# Patient Record
Sex: Female | Born: 1935 | Race: White | Hispanic: No | Marital: Married | Smoking: Former smoker
Health system: Southern US, Community
[De-identification: ages and names within clinical notes are randomized; demographics above are authoritative.]

## PROBLEM LIST (undated history)

## (undated) DIAGNOSIS — I10 Essential (primary) hypertension: Secondary | ICD-10-CM

## (undated) DIAGNOSIS — E119 Type 2 diabetes mellitus without complications: Secondary | ICD-10-CM

---

## 2008-07-18 DIAGNOSIS — I2699 Other pulmonary embolism without acute cor pulmonale: Secondary | ICD-10-CM

## 2008-07-18 HISTORY — PX: REPLACEMENT TOTAL KNEE: SUR1224

## 2008-07-18 HISTORY — DX: Other pulmonary embolism without acute cor pulmonale: I26.99

## 2009-05-20 ENCOUNTER — Ambulatory Visit (HOSPITAL_COMMUNITY): Admission: RE | Admit: 2009-05-20 | Discharge: 2009-05-20 | Payer: Self-pay | Admitting: Orthopedic Surgery

## 2009-05-22 ENCOUNTER — Inpatient Hospital Stay (HOSPITAL_COMMUNITY): Admission: RE | Admit: 2009-05-22 | Discharge: 2009-06-01 | Payer: Self-pay | Admitting: Orthopedic Surgery

## 2009-05-22 ENCOUNTER — Ambulatory Visit (HOSPITAL_COMMUNITY): Admission: RE | Admit: 2009-05-22 | Discharge: 2009-05-22 | Payer: Self-pay | Admitting: Orthopedic Surgery

## 2009-05-24 ENCOUNTER — Encounter (INDEPENDENT_AMBULATORY_CARE_PROVIDER_SITE_OTHER): Payer: Self-pay | Admitting: Internal Medicine

## 2009-05-25 ENCOUNTER — Encounter (INDEPENDENT_AMBULATORY_CARE_PROVIDER_SITE_OTHER): Payer: Self-pay | Admitting: Internal Medicine

## 2009-05-26 ENCOUNTER — Ambulatory Visit: Payer: Self-pay | Admitting: Vascular Surgery

## 2010-05-12 IMAGING — CT CT ANGIO CHEST
2 of 6 series · 19 of 36 positions shown · IV contrast (APPLIED)
Comparison: None.

CLINICAL DATA: Postop day #2 from knee replacement.  Decreasing O2
sats.  Tachycardia.

CT ANGIOGRAPHY CHEST WITH CONTRAST
TECHNIQUE: Multidetector CT imaging of the chest was performed
using the standard protocol during bolus administration of
intravenous contrast. Multiplanar CT image reconstructions
including MIPs were obtained to evaluate the vascular anatomy.
Contrast: 80 ml [id number]-D88 .

[Series 6: thins · axial · 0.70mm/px · z∈[+954,+1174]mm · 18 of 246 slices shown]
[im 13/246  lung]
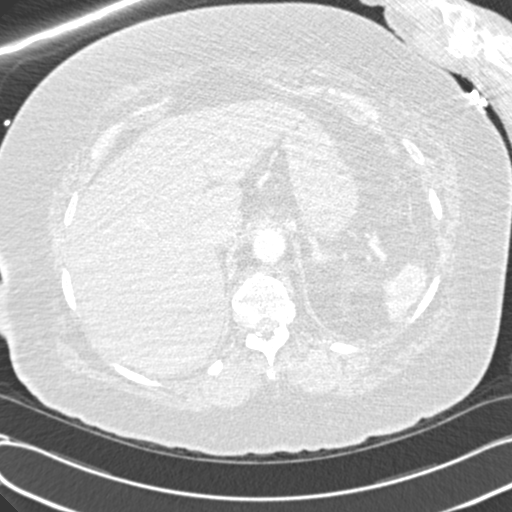
[im 25/246  mediastinal]
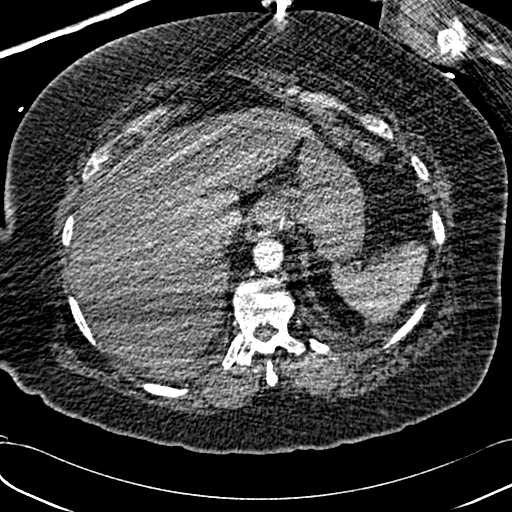
[im 37/246  lung]
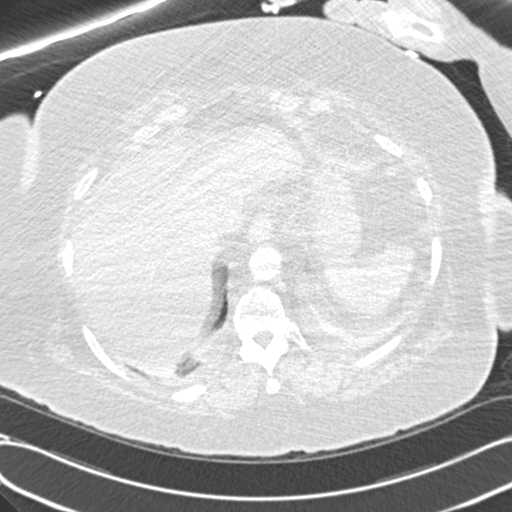
[im 50/246  mediastinal]
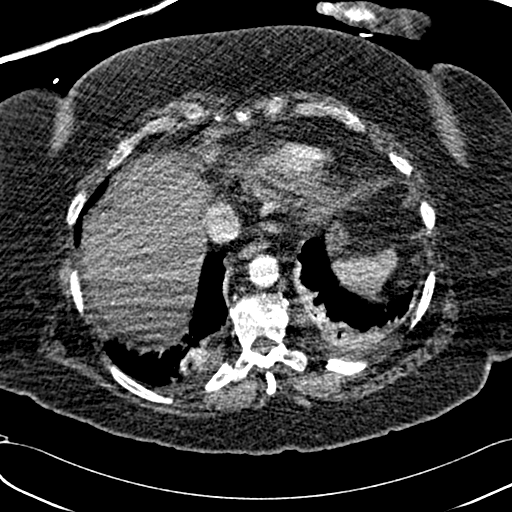
[im 62/246  lung]
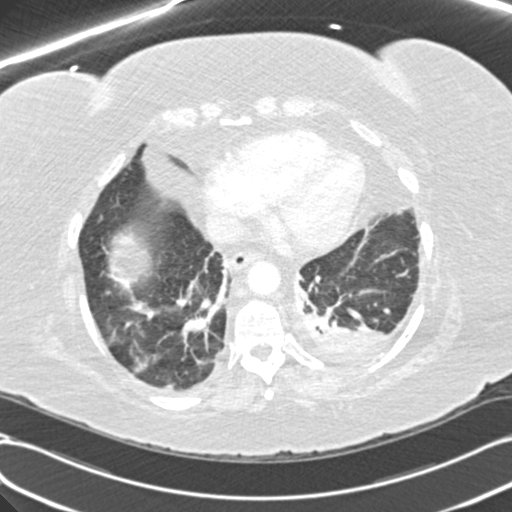
[im 74/246  mediastinal]
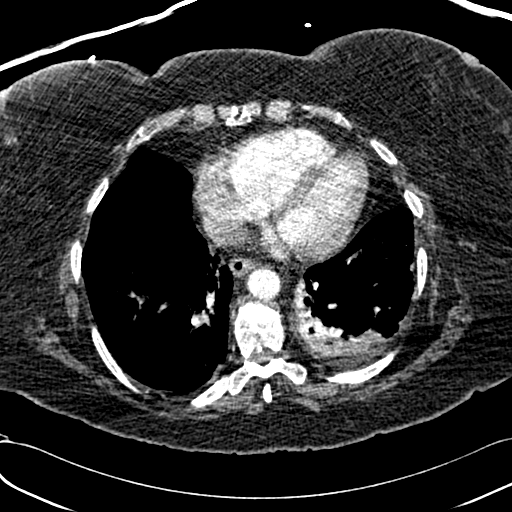
[im 86/246  lung]
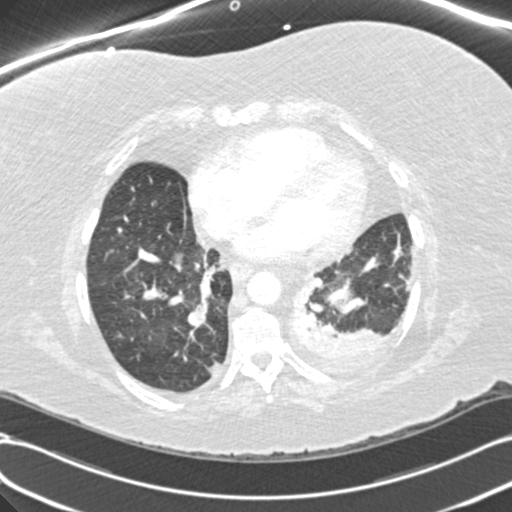
[im 99/246  mediastinal]
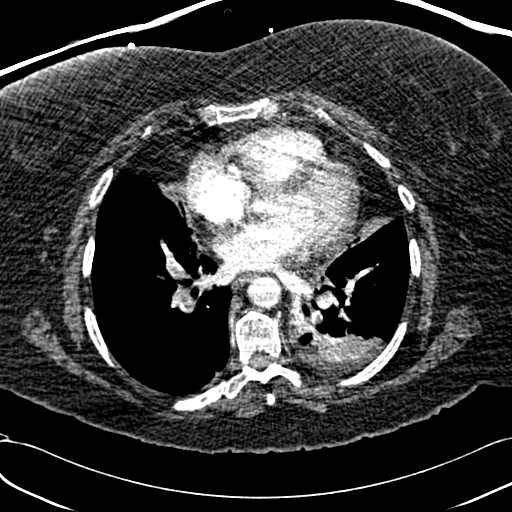
[im 111/246  lung]
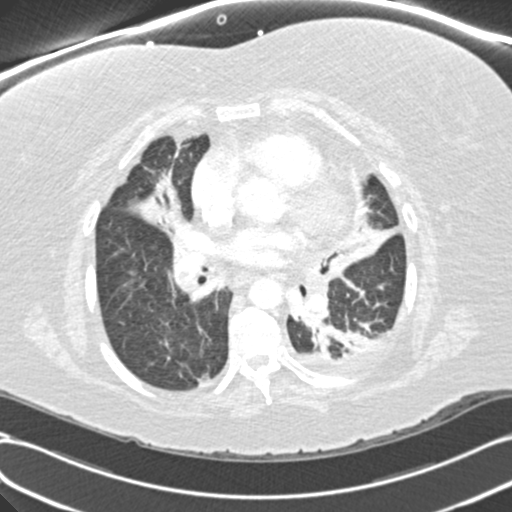
[im 135/246  mediastinal]
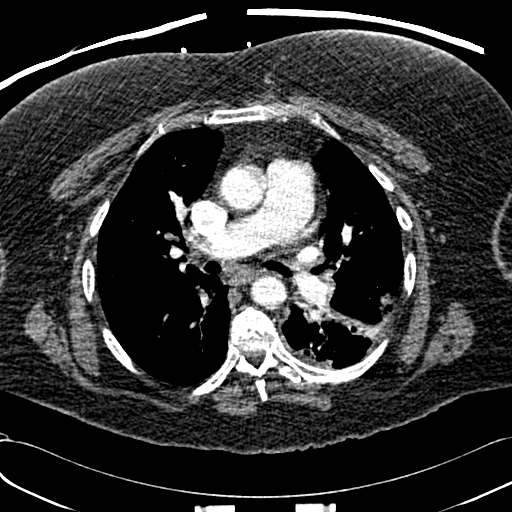
[im 148/246  lung]
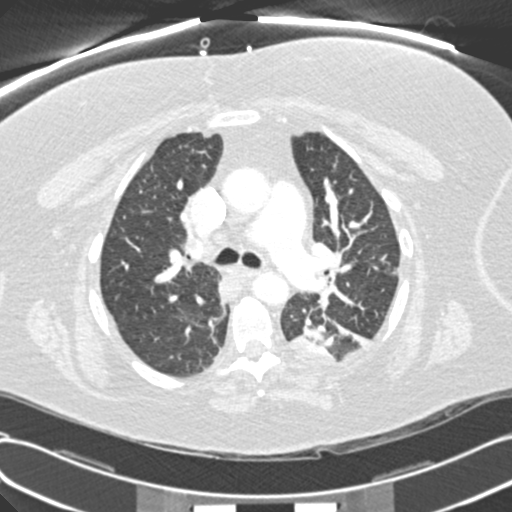
[im 160/246  mediastinal]
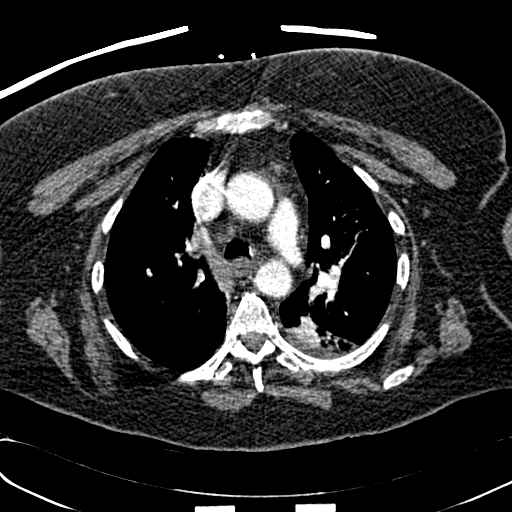
[im 172/246  lung]
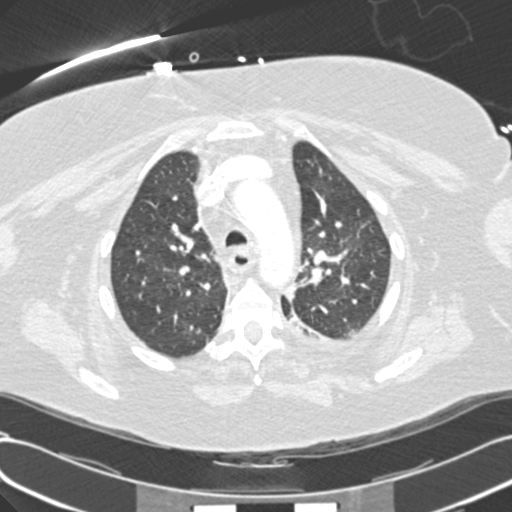
[im 184/246  mediastinal]
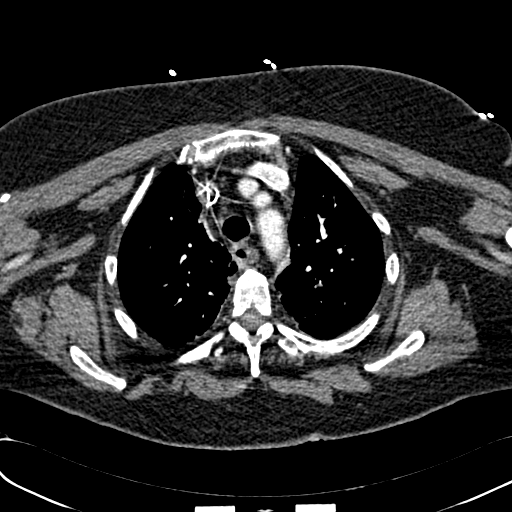
[im 197/246  lung]
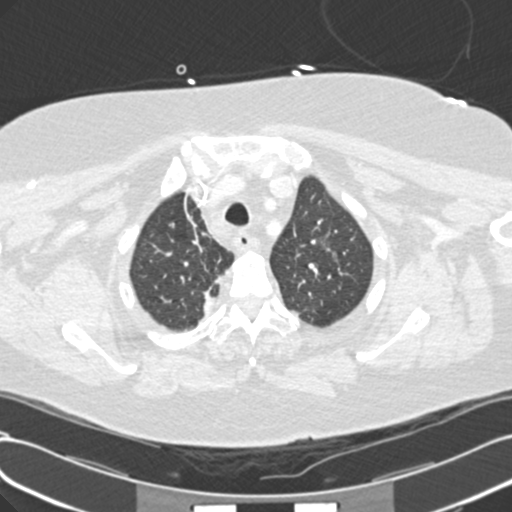
[im 209/246  mediastinal]
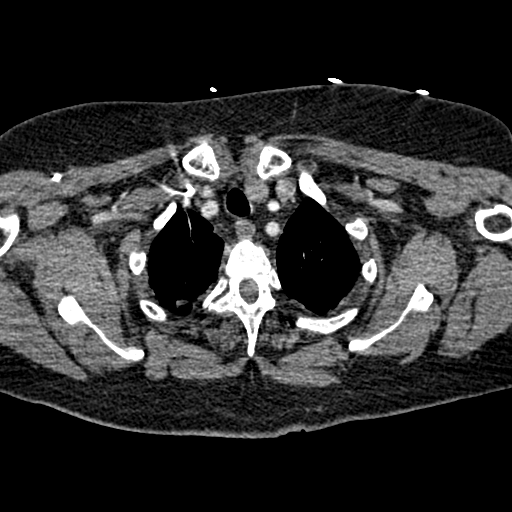
[im 221/246  lung]
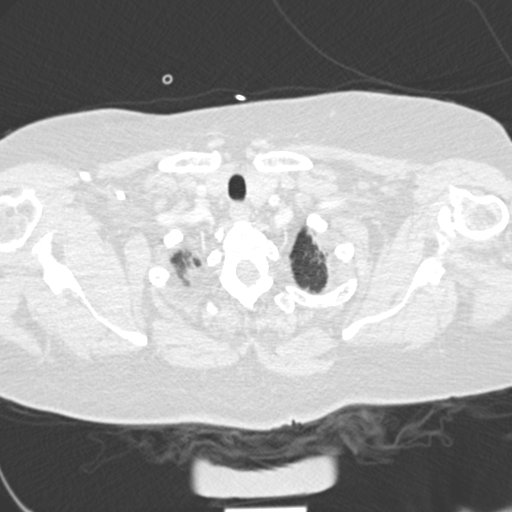
[im 233/246  mediastinal]
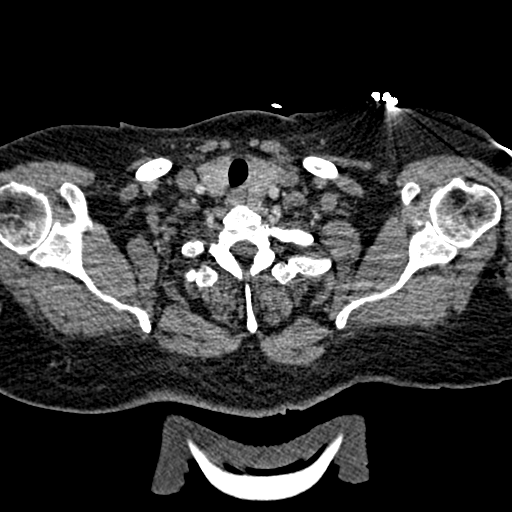

[Series 604: coronal mpr · coronal · 0.70mm/px · 1 of 124 slices shown]
[im 62/124  mediastinal]
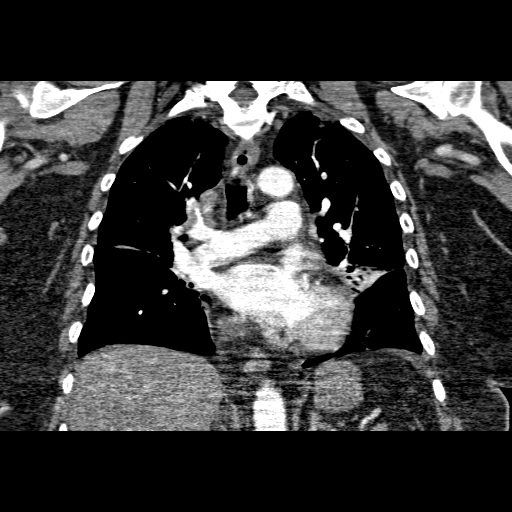

[19 of 36 positions shown; findings below may reference images not displayed]

FINDINGS: The patient has acute pulmonary embolus seen lobar
pulmonary arteries to the right upper, middle and lower lobes.
Pulmonary embolus is seen in the interlobar pulmonary artery.  On
the left, there is a small embolic burden to segmental arteries in
the upper lobe.  Embolus is seen in segmental and subsegmental
pulmonary arteries to the left lower lobe.

The right PICC line tip is positioned at the distal SVC, just
central to the azygos confluence.  There is no axillary
lymphadenopathy.  No mediastinal or hilar lymphadenopathy.  The
heart is at upper limits of normal for size.  No pericardial
effusion.  No paradoxical inversion of the interventricular septum
at this time.

Lung windows show right middle lobe and lingular atelectasis.
There is atelectasis in both lower lobes.

Bone windows reveal no worrisome lytic or sclerotic osseous
lesions.

Review of the MIP images confirms the above findings.
IMPRESSION: Acute pulmonary embolus to all lobes of both lungs. No evidence for
paradoxical inversion of the interventricular septum at this time.

Subsegmental atelectasis is seen in the right middle and lower
lobes as well as the lingula and left lower lobe.

I discussed these critical test results with Dr. Djap at the
time of interpretation (05/24/2009 5: [DATE]).

## 2010-10-20 LAB — BLOOD GAS, ARTERIAL
Acid-Base Excess: 6.1 mmol/L — ABNORMAL HIGH (ref 0.0–2.0)
Bicarbonate: 33.1 mEq/L — ABNORMAL HIGH (ref 20.0–24.0)
Bicarbonate: 33.6 mEq/L — ABNORMAL HIGH (ref 20.0–24.0)
Drawn by: 103701
O2 Saturation: 95.8 %
O2 Saturation: 97.4 %
Patient temperature: 37
TCO2: 30.5 mmol/L (ref 0–100)
pCO2 arterial: 65.9 mmHg (ref 35.0–45.0)
pCO2 arterial: 73.4 mmHg (ref 35.0–45.0)
pH, Arterial: 7.276 — ABNORMAL LOW (ref 7.350–7.400)
pH, Arterial: 7.327 — ABNORMAL LOW (ref 7.350–7.400)
pO2, Arterial: 95.4 mmHg (ref 80.0–100.0)

## 2010-10-20 LAB — CBC
HCT: 32.8 % — ABNORMAL LOW (ref 36.0–46.0)
HCT: 33.2 % — ABNORMAL LOW (ref 36.0–46.0)
HCT: 33.6 % — ABNORMAL LOW (ref 36.0–46.0)
HCT: 33.6 % — ABNORMAL LOW (ref 36.0–46.0)
HCT: 33.8 % — ABNORMAL LOW (ref 36.0–46.0)
HCT: 36.5 % (ref 36.0–46.0)
Hemoglobin: 10.6 g/dL — ABNORMAL LOW (ref 12.0–15.0)
Hemoglobin: 11.2 g/dL — ABNORMAL LOW (ref 12.0–15.0)
Hemoglobin: 11.4 g/dL — ABNORMAL LOW (ref 12.0–15.0)
Hemoglobin: 11.4 g/dL — ABNORMAL LOW (ref 12.0–15.0)
Hemoglobin: 12 g/dL (ref 12.0–15.0)
Hemoglobin: 13.1 g/dL (ref 12.0–15.0)
MCHC: 32.8 g/dL (ref 30.0–36.0)
MCHC: 32.8 g/dL (ref 30.0–36.0)
MCHC: 32.9 g/dL (ref 30.0–36.0)
MCHC: 33.5 g/dL (ref 30.0–36.0)
MCHC: 34.3 g/dL (ref 30.0–36.0)
MCV: 92.4 fL (ref 78.0–100.0)
MCV: 93.9 fL (ref 78.0–100.0)
MCV: 94.7 fL (ref 78.0–100.0)
MCV: 95.2 fL (ref 78.0–100.0)
Platelets: 151 10*3/uL (ref 150–400)
Platelets: 182 10*3/uL (ref 150–400)
Platelets: 187 10*3/uL (ref 150–400)
Platelets: 201 10*3/uL (ref 150–400)
Platelets: 258 10*3/uL (ref 150–400)
Platelets: 317 10*3/uL (ref 150–400)
RBC: 3.38 MIL/uL — ABNORMAL LOW (ref 3.87–5.11)
RBC: 3.47 MIL/uL — ABNORMAL LOW (ref 3.87–5.11)
RBC: 3.58 MIL/uL — ABNORMAL LOW (ref 3.87–5.11)
RBC: 3.6 MIL/uL — ABNORMAL LOW (ref 3.87–5.11)
RBC: 3.83 MIL/uL — ABNORMAL LOW (ref 3.87–5.11)
RDW: 13.1 % (ref 11.5–15.5)
RDW: 13.3 % (ref 11.5–15.5)
RDW: 13.5 % (ref 11.5–15.5)
RDW: 13.7 % (ref 11.5–15.5)
RDW: 13.7 % (ref 11.5–15.5)
WBC: 8.8 10*3/uL (ref 4.0–10.5)
WBC: 9 10*3/uL (ref 4.0–10.5)
WBC: 9.2 10*3/uL (ref 4.0–10.5)
WBC: 9.6 10*3/uL (ref 4.0–10.5)

## 2010-10-20 LAB — COMPREHENSIVE METABOLIC PANEL
ALT: 19 U/L (ref 0–35)
AST: 24 U/L (ref 0–37)
AST: 39 U/L — ABNORMAL HIGH (ref 0–37)
AST: 42 U/L — ABNORMAL HIGH (ref 0–37)
Albumin: 2.2 g/dL — ABNORMAL LOW (ref 3.5–5.2)
Albumin: 2.4 g/dL — ABNORMAL LOW (ref 3.5–5.2)
Alkaline Phosphatase: 122 U/L — ABNORMAL HIGH (ref 39–117)
Alkaline Phosphatase: 90 U/L (ref 39–117)
BUN: 13 mg/dL (ref 6–23)
CO2: 35 mEq/L — ABNORMAL HIGH (ref 19–32)
Calcium: 8 mg/dL — ABNORMAL LOW (ref 8.4–10.5)
Chloride: 100 mEq/L (ref 96–112)
Creatinine, Ser: 0.69 mg/dL (ref 0.4–1.2)
GFR calc Af Amer: >60 mL/min (ref >60)
GFR calc Af Amer: >60 mL/min (ref >60)
GFR calc Af Amer: >60 mL/min (ref >60)
Glucose, Bld: 150 mg/dL — ABNORMAL HIGH (ref 70–99)
Potassium: 3.6 mEq/L (ref 3.5–5.1)
Potassium: 3.6 mEq/L (ref 3.5–5.1)
Sodium: 136 mEq/L (ref 135–145)
Sodium: 141 mEq/L (ref 135–145)
Total Bilirubin: 0.6 mg/dL (ref 0.3–1.2)
Total Protein: 6.2 g/dL (ref 6.0–8.3)
Total Protein: 6.6 g/dL (ref 6.0–8.3)

## 2010-10-20 LAB — GLUCOSE, CAPILLARY
Glucose-Capillary: 100 mg/dL — ABNORMAL HIGH (ref 70–99)
Glucose-Capillary: 102 mg/dL — ABNORMAL HIGH (ref 70–99)
Glucose-Capillary: 116 mg/dL — ABNORMAL HIGH (ref 70–99)
Glucose-Capillary: 119 mg/dL — ABNORMAL HIGH (ref 70–99)
Glucose-Capillary: 119 mg/dL — ABNORMAL HIGH (ref 70–99)
Glucose-Capillary: 120 mg/dL — ABNORMAL HIGH (ref 70–99)
Glucose-Capillary: 135 mg/dL — ABNORMAL HIGH (ref 70–99)
Glucose-Capillary: 139 mg/dL — ABNORMAL HIGH (ref 70–99)
Glucose-Capillary: 151 mg/dL — ABNORMAL HIGH (ref 70–99)
Glucose-Capillary: 154 mg/dL — ABNORMAL HIGH (ref 70–99)
Glucose-Capillary: 154 mg/dL — ABNORMAL HIGH (ref 70–99)
Glucose-Capillary: 155 mg/dL — ABNORMAL HIGH (ref 70–99)
Glucose-Capillary: 155 mg/dL — ABNORMAL HIGH (ref 70–99)
Glucose-Capillary: 157 mg/dL — ABNORMAL HIGH (ref 70–99)
Glucose-Capillary: 163 mg/dL — ABNORMAL HIGH (ref 70–99)
Glucose-Capillary: 169 mg/dL — ABNORMAL HIGH (ref 70–99)
Glucose-Capillary: 170 mg/dL — ABNORMAL HIGH (ref 70–99)
Glucose-Capillary: 187 mg/dL — ABNORMAL HIGH (ref 70–99)
Glucose-Capillary: 188 mg/dL — ABNORMAL HIGH (ref 70–99)
Glucose-Capillary: 193 mg/dL — ABNORMAL HIGH (ref 70–99)
Glucose-Capillary: 219 mg/dL — ABNORMAL HIGH (ref 70–99)

## 2010-10-20 LAB — BASIC METABOLIC PANEL
BUN: 11 mg/dL (ref 6–23)
BUN: 5 mg/dL — ABNORMAL LOW (ref 6–23)
CO2: 29 mEq/L (ref 19–32)
Calcium: 8.1 mg/dL — ABNORMAL LOW (ref 8.4–10.5)
Creatinine, Ser: 0.91 mg/dL (ref 0.4–1.2)
GFR calc Af Amer: >60 mL/min (ref >60)
GFR calc Af Amer: >60 mL/min (ref >60)
GFR calc non Af Amer: >60 mL/min (ref >60)
GFR calc non Af Amer: >60 mL/min (ref >60)
Glucose, Bld: 201 mg/dL — ABNORMAL HIGH (ref 70–99)
Potassium: 3.9 mEq/L (ref 3.5–5.1)
Potassium: 4.1 mEq/L (ref 3.5–5.1)
Potassium: 4.4 mEq/L (ref 3.5–5.1)
Sodium: 135 mEq/L (ref 135–145)

## 2010-10-20 LAB — LUPUS ANTICOAGULANT PANEL
Drvvt confirmation: 1.14 Ratio (ref <1.18)
PTTLA Confirmation: 22.4 secs — ABNORMAL HIGH (ref <8.0)
dRVVT Incubated 1:1 Mix: 40.6 secs (ref 36.1–47.0)

## 2010-10-20 LAB — PROTIME-INR
INR: 1.21 (ref 0.00–1.49)
INR: 1.7 — ABNORMAL HIGH (ref 0.00–1.49)
INR: 1.79 — ABNORMAL HIGH (ref 0.00–1.49)
INR: 1.85 — ABNORMAL HIGH (ref 0.00–1.49)
INR: 2.17 — ABNORMAL HIGH (ref 0.00–1.49)
Prothrombin Time: 12.9 seconds (ref 11.6–15.2)
Prothrombin Time: 15.2 seconds (ref 11.6–15.2)
Prothrombin Time: 19.8 seconds — ABNORMAL HIGH (ref 11.6–15.2)
Prothrombin Time: 21.2 seconds — ABNORMAL HIGH (ref 11.6–15.2)
Prothrombin Time: 21.8 seconds — ABNORMAL HIGH (ref 11.6–15.2)

## 2010-10-20 LAB — CARDIAC PANEL(CRET KIN+CKTOT+MB+TROPI)
CK, MB: 1.2 ng/mL (ref 0.3–4.0)
Relative Index: INVALID (ref 0.0–2.5)
Total CK: 98 U/L (ref 7–177)
Troponin I: 0.11 ng/mL — ABNORMAL HIGH (ref 0.00–0.06)

## 2010-10-20 LAB — DIFFERENTIAL
Basophils Absolute: 0 10*3/uL (ref 0.0–0.1)
Basophils Relative: 0 % (ref 0–1)
Eosinophils Absolute: 0.4 10*3/uL (ref 0.0–0.7)
Eosinophils Relative: 4 % (ref 0–5)
Eosinophils Relative: 4 % (ref 0–5)
Lymphocytes Relative: 29 % (ref 12–46)
Lymphs Abs: 2.3 10*3/uL (ref 0.7–4.0)
Monocytes Absolute: 0.5 10*3/uL (ref 0.1–1.0)
Monocytes Absolute: 0.6 10*3/uL (ref 0.1–1.0)
Monocytes Relative: 6 % (ref 3–12)

## 2010-10-20 LAB — TYPE AND SCREEN
ABO/RH(D): A NEG
Antibody Screen: POSITIVE
DAT, IgG: NEGATIVE

## 2010-10-20 LAB — BETA-2-GLYCOPROTEIN I ABS, IGG/M/A
Beta-2 Glyco I IgG: 3 U/mL (ref <=15)
Beta-2-Glycoprotein I IgA: <3 U/mL (ref <=15)

## 2010-10-20 LAB — HEPARIN LEVEL (UNFRACTIONATED)
Heparin Unfractionated: 0.3 IU/mL (ref 0.30–0.70)
Heparin Unfractionated: 0.3 IU/mL (ref 0.30–0.70)
Heparin Unfractionated: 0.47 IU/mL (ref 0.30–0.70)

## 2010-10-20 LAB — PROTHROMBIN GENE MUTATION

## 2010-10-20 LAB — HEMOGLOBIN A1C: Hgb A1c MFr Bld: 6.9 % — ABNORMAL HIGH (ref 4.6–6.1)

## 2010-10-20 LAB — PROTEIN C, TOTAL: Protein C, Total: 59 % — ABNORMAL LOW (ref 70–140)

## 2010-10-20 LAB — ANTITHROMBIN III: AntiThromb III Func: 121 % — ABNORMAL HIGH (ref 76–126)

## 2010-10-21 LAB — CBC
HCT: 46.7 % — ABNORMAL HIGH (ref 36.0–46.0)
MCV: 95.1 fL (ref 78.0–100.0)
RBC: 4.91 MIL/uL (ref 3.87–5.11)
WBC: 7 10*3/uL (ref 4.0–10.5)

## 2010-10-21 LAB — URINALYSIS, ROUTINE W REFLEX MICROSCOPIC
Nitrite: NEGATIVE
Specific Gravity, Urine: 1.018 (ref 1.005–1.030)
Urobilinogen, UA: 0.2 mg/dL (ref 0.0–1.0)
pH: 7 (ref 5.0–8.0)

## 2010-10-21 LAB — TYPE AND SCREEN: DAT, IgG: NEGATIVE

## 2010-10-21 LAB — COMPREHENSIVE METABOLIC PANEL
AST: 22 U/L (ref 0–37)
Alkaline Phosphatase: 91 U/L (ref 39–117)
BUN: 15 mg/dL (ref 6–23)
CO2: 31 mEq/L (ref 19–32)
Chloride: 102 mEq/L (ref 96–112)
Creatinine, Ser: 0.82 mg/dL (ref 0.4–1.2)
GFR calc Af Amer: >60 mL/min (ref >60)
GFR calc non Af Amer: >60 mL/min (ref >60)
Total Bilirubin: 0.7 mg/dL (ref 0.3–1.2)

## 2010-10-21 LAB — APTT: aPTT: 22 seconds — ABNORMAL LOW (ref 24–37)

## 2010-10-21 LAB — ABO/RH: ABO/RH(D): A NEG

## 2014-08-01 DIAGNOSIS — R0902 Hypoxemia: Secondary | ICD-10-CM | POA: Diagnosis not present

## 2014-09-01 DIAGNOSIS — R0902 Hypoxemia: Secondary | ICD-10-CM | POA: Diagnosis not present

## 2014-09-30 DIAGNOSIS — R0902 Hypoxemia: Secondary | ICD-10-CM | POA: Diagnosis not present

## 2014-10-31 DIAGNOSIS — R0902 Hypoxemia: Secondary | ICD-10-CM | POA: Diagnosis not present

## 2014-11-05 DIAGNOSIS — N183 Chronic kidney disease, stage 3 (moderate): Secondary | ICD-10-CM | POA: Diagnosis not present

## 2014-11-05 DIAGNOSIS — Z1389 Encounter for screening for other disorder: Secondary | ICD-10-CM | POA: Diagnosis not present

## 2014-11-05 DIAGNOSIS — E782 Mixed hyperlipidemia: Secondary | ICD-10-CM | POA: Diagnosis not present

## 2014-11-05 DIAGNOSIS — E119 Type 2 diabetes mellitus without complications: Secondary | ICD-10-CM | POA: Diagnosis not present

## 2014-11-05 DIAGNOSIS — Z9181 History of falling: Secondary | ICD-10-CM | POA: Diagnosis not present

## 2014-11-05 DIAGNOSIS — I10 Essential (primary) hypertension: Secondary | ICD-10-CM | POA: Diagnosis not present

## 2014-11-30 DIAGNOSIS — R0902 Hypoxemia: Secondary | ICD-10-CM | POA: Diagnosis not present

## 2014-12-31 DIAGNOSIS — R0902 Hypoxemia: Secondary | ICD-10-CM | POA: Diagnosis not present

## 2015-01-30 DIAGNOSIS — R0902 Hypoxemia: Secondary | ICD-10-CM | POA: Diagnosis not present

## 2015-03-02 DIAGNOSIS — R0902 Hypoxemia: Secondary | ICD-10-CM | POA: Diagnosis not present

## 2015-03-17 DIAGNOSIS — E538 Deficiency of other specified B group vitamins: Secondary | ICD-10-CM | POA: Diagnosis not present

## 2015-03-17 DIAGNOSIS — Z1389 Encounter for screening for other disorder: Secondary | ICD-10-CM | POA: Diagnosis not present

## 2015-03-17 DIAGNOSIS — E782 Mixed hyperlipidemia: Secondary | ICD-10-CM | POA: Diagnosis not present

## 2015-03-17 DIAGNOSIS — I10 Essential (primary) hypertension: Secondary | ICD-10-CM | POA: Diagnosis not present

## 2015-03-17 DIAGNOSIS — Z139 Encounter for screening, unspecified: Secondary | ICD-10-CM | POA: Diagnosis not present

## 2015-03-17 DIAGNOSIS — E1165 Type 2 diabetes mellitus with hyperglycemia: Secondary | ICD-10-CM | POA: Diagnosis not present

## 2015-03-17 DIAGNOSIS — Z9181 History of falling: Secondary | ICD-10-CM | POA: Diagnosis not present

## 2015-03-17 DIAGNOSIS — N183 Chronic kidney disease, stage 3 (moderate): Secondary | ICD-10-CM | POA: Diagnosis not present

## 2015-03-17 DIAGNOSIS — Z6841 Body Mass Index (BMI) 40.0 and over, adult: Secondary | ICD-10-CM | POA: Diagnosis not present

## 2015-03-20 DIAGNOSIS — E119 Type 2 diabetes mellitus without complications: Secondary | ICD-10-CM | POA: Diagnosis not present

## 2015-04-02 DIAGNOSIS — R0902 Hypoxemia: Secondary | ICD-10-CM | POA: Diagnosis not present

## 2015-05-02 DIAGNOSIS — R0902 Hypoxemia: Secondary | ICD-10-CM | POA: Diagnosis not present

## 2015-06-02 DIAGNOSIS — R0902 Hypoxemia: Secondary | ICD-10-CM | POA: Diagnosis not present

## 2015-07-02 DIAGNOSIS — R0902 Hypoxemia: Secondary | ICD-10-CM | POA: Diagnosis not present

## 2015-07-17 DIAGNOSIS — E1165 Type 2 diabetes mellitus with hyperglycemia: Secondary | ICD-10-CM | POA: Diagnosis not present

## 2015-07-17 DIAGNOSIS — N183 Chronic kidney disease, stage 3 (moderate): Secondary | ICD-10-CM | POA: Diagnosis not present

## 2015-07-17 DIAGNOSIS — R609 Edema, unspecified: Secondary | ICD-10-CM | POA: Diagnosis not present

## 2015-07-17 DIAGNOSIS — I10 Essential (primary) hypertension: Secondary | ICD-10-CM | POA: Diagnosis not present

## 2015-07-17 DIAGNOSIS — Z6841 Body Mass Index (BMI) 40.0 and over, adult: Secondary | ICD-10-CM | POA: Diagnosis not present

## 2015-08-02 DIAGNOSIS — R0902 Hypoxemia: Secondary | ICD-10-CM | POA: Diagnosis not present

## 2015-09-02 DIAGNOSIS — R0902 Hypoxemia: Secondary | ICD-10-CM | POA: Diagnosis not present

## 2015-09-30 DIAGNOSIS — R0902 Hypoxemia: Secondary | ICD-10-CM | POA: Diagnosis not present

## 2015-10-31 DIAGNOSIS — R0902 Hypoxemia: Secondary | ICD-10-CM | POA: Diagnosis not present

## 2015-11-30 DIAGNOSIS — R0902 Hypoxemia: Secondary | ICD-10-CM | POA: Diagnosis not present

## 2015-12-31 DIAGNOSIS — R0902 Hypoxemia: Secondary | ICD-10-CM | POA: Diagnosis not present

## 2016-01-15 DIAGNOSIS — E119 Type 2 diabetes mellitus without complications: Secondary | ICD-10-CM | POA: Diagnosis not present

## 2016-01-15 DIAGNOSIS — Z79899 Other long term (current) drug therapy: Secondary | ICD-10-CM | POA: Diagnosis not present

## 2016-01-15 DIAGNOSIS — E782 Mixed hyperlipidemia: Secondary | ICD-10-CM | POA: Diagnosis not present

## 2016-01-15 DIAGNOSIS — N183 Chronic kidney disease, stage 3 (moderate): Secondary | ICD-10-CM | POA: Diagnosis not present

## 2016-01-15 DIAGNOSIS — M25561 Pain in right knee: Secondary | ICD-10-CM | POA: Diagnosis not present

## 2016-01-27 DIAGNOSIS — E875 Hyperkalemia: Secondary | ICD-10-CM | POA: Diagnosis not present

## 2016-01-30 DIAGNOSIS — R0902 Hypoxemia: Secondary | ICD-10-CM | POA: Diagnosis not present

## 2016-03-01 DIAGNOSIS — R0902 Hypoxemia: Secondary | ICD-10-CM | POA: Diagnosis not present

## 2016-04-01 DIAGNOSIS — R0902 Hypoxemia: Secondary | ICD-10-CM | POA: Diagnosis not present

## 2016-05-01 DIAGNOSIS — R0902 Hypoxemia: Secondary | ICD-10-CM | POA: Diagnosis not present

## 2016-10-30 DIAGNOSIS — R0902 Hypoxemia: Secondary | ICD-10-CM | POA: Diagnosis not present

## 2016-11-29 DIAGNOSIS — R0902 Hypoxemia: Secondary | ICD-10-CM | POA: Diagnosis not present

## 2016-12-30 DIAGNOSIS — R0902 Hypoxemia: Secondary | ICD-10-CM | POA: Diagnosis not present

## 2017-01-24 DIAGNOSIS — Z9181 History of falling: Secondary | ICD-10-CM | POA: Diagnosis not present

## 2017-01-24 DIAGNOSIS — N183 Chronic kidney disease, stage 3 (moderate): Secondary | ICD-10-CM | POA: Diagnosis not present

## 2017-01-24 DIAGNOSIS — E119 Type 2 diabetes mellitus without complications: Secondary | ICD-10-CM | POA: Diagnosis not present

## 2017-01-24 DIAGNOSIS — E538 Deficiency of other specified B group vitamins: Secondary | ICD-10-CM | POA: Diagnosis not present

## 2017-01-24 DIAGNOSIS — E782 Mixed hyperlipidemia: Secondary | ICD-10-CM | POA: Diagnosis not present

## 2017-01-24 DIAGNOSIS — Z139 Encounter for screening, unspecified: Secondary | ICD-10-CM | POA: Diagnosis not present

## 2017-01-29 DIAGNOSIS — R0902 Hypoxemia: Secondary | ICD-10-CM | POA: Diagnosis not present

## 2017-03-01 DIAGNOSIS — R0902 Hypoxemia: Secondary | ICD-10-CM | POA: Diagnosis not present

## 2017-03-14 DIAGNOSIS — E785 Hyperlipidemia, unspecified: Secondary | ICD-10-CM | POA: Diagnosis not present

## 2017-03-14 DIAGNOSIS — Z139 Encounter for screening, unspecified: Secondary | ICD-10-CM | POA: Diagnosis not present

## 2017-03-14 DIAGNOSIS — Z136 Encounter for screening for cardiovascular disorders: Secondary | ICD-10-CM | POA: Diagnosis not present

## 2017-03-14 DIAGNOSIS — Z1389 Encounter for screening for other disorder: Secondary | ICD-10-CM | POA: Diagnosis not present

## 2017-03-14 DIAGNOSIS — Z0000 Encounter for general adult medical examination without abnormal findings: Secondary | ICD-10-CM | POA: Diagnosis not present

## 2017-03-14 DIAGNOSIS — Z9181 History of falling: Secondary | ICD-10-CM | POA: Diagnosis not present

## 2017-04-01 DIAGNOSIS — R0902 Hypoxemia: Secondary | ICD-10-CM | POA: Diagnosis not present

## 2017-05-01 DIAGNOSIS — R0902 Hypoxemia: Secondary | ICD-10-CM | POA: Diagnosis not present

## 2017-06-01 DIAGNOSIS — R0902 Hypoxemia: Secondary | ICD-10-CM | POA: Diagnosis not present

## 2017-07-01 DIAGNOSIS — R0902 Hypoxemia: Secondary | ICD-10-CM | POA: Diagnosis not present

## 2017-07-27 DIAGNOSIS — E119 Type 2 diabetes mellitus without complications: Secondary | ICD-10-CM | POA: Diagnosis not present

## 2017-07-27 DIAGNOSIS — N183 Chronic kidney disease, stage 3 (moderate): Secondary | ICD-10-CM | POA: Diagnosis not present

## 2017-07-27 DIAGNOSIS — J449 Chronic obstructive pulmonary disease, unspecified: Secondary | ICD-10-CM | POA: Diagnosis not present

## 2017-07-27 DIAGNOSIS — Z1331 Encounter for screening for depression: Secondary | ICD-10-CM | POA: Diagnosis not present

## 2017-07-27 DIAGNOSIS — E782 Mixed hyperlipidemia: Secondary | ICD-10-CM | POA: Diagnosis not present

## 2017-07-27 DIAGNOSIS — E538 Deficiency of other specified B group vitamins: Secondary | ICD-10-CM | POA: Diagnosis not present

## 2017-07-28 DIAGNOSIS — R0902 Hypoxemia: Secondary | ICD-10-CM | POA: Diagnosis not present

## 2017-08-01 DIAGNOSIS — R0902 Hypoxemia: Secondary | ICD-10-CM | POA: Diagnosis not present

## 2017-08-21 DIAGNOSIS — R0902 Hypoxemia: Secondary | ICD-10-CM | POA: Diagnosis not present

## 2017-08-21 DIAGNOSIS — J441 Chronic obstructive pulmonary disease with (acute) exacerbation: Secondary | ICD-10-CM | POA: Diagnosis not present

## 2017-08-22 DIAGNOSIS — E875 Hyperkalemia: Secondary | ICD-10-CM | POA: Diagnosis not present

## 2017-08-22 DIAGNOSIS — R0600 Dyspnea, unspecified: Secondary | ICD-10-CM | POA: Diagnosis not present

## 2017-08-22 DIAGNOSIS — J189 Pneumonia, unspecified organism: Secondary | ICD-10-CM | POA: Diagnosis not present

## 2017-08-22 DIAGNOSIS — R0602 Shortness of breath: Secondary | ICD-10-CM | POA: Diagnosis not present

## 2017-08-22 DIAGNOSIS — R7989 Other specified abnormal findings of blood chemistry: Secondary | ICD-10-CM | POA: Diagnosis not present

## 2017-08-22 DIAGNOSIS — I5032 Chronic diastolic (congestive) heart failure: Secondary | ICD-10-CM | POA: Diagnosis not present

## 2017-08-22 DIAGNOSIS — Z87891 Personal history of nicotine dependence: Secondary | ICD-10-CM | POA: Diagnosis not present

## 2017-08-22 DIAGNOSIS — Z7984 Long term (current) use of oral hypoglycemic drugs: Secondary | ICD-10-CM | POA: Diagnosis not present

## 2017-08-22 DIAGNOSIS — I110 Hypertensive heart disease with heart failure: Secondary | ICD-10-CM | POA: Diagnosis not present

## 2017-08-22 DIAGNOSIS — J441 Chronic obstructive pulmonary disease with (acute) exacerbation: Secondary | ICD-10-CM | POA: Diagnosis not present

## 2017-08-22 DIAGNOSIS — I272 Pulmonary hypertension, unspecified: Secondary | ICD-10-CM | POA: Diagnosis not present

## 2017-08-22 DIAGNOSIS — Z86711 Personal history of pulmonary embolism: Secondary | ICD-10-CM | POA: Diagnosis not present

## 2017-08-22 DIAGNOSIS — I519 Heart disease, unspecified: Secondary | ICD-10-CM | POA: Diagnosis not present

## 2017-08-22 DIAGNOSIS — J440 Chronic obstructive pulmonary disease with acute lower respiratory infection: Secondary | ICD-10-CM | POA: Diagnosis not present

## 2017-08-22 DIAGNOSIS — Z01810 Encounter for preprocedural cardiovascular examination: Secondary | ICD-10-CM | POA: Diagnosis not present

## 2017-08-22 DIAGNOSIS — I5030 Unspecified diastolic (congestive) heart failure: Secondary | ICD-10-CM | POA: Diagnosis not present

## 2017-08-22 DIAGNOSIS — E119 Type 2 diabetes mellitus without complications: Secondary | ICD-10-CM | POA: Diagnosis not present

## 2017-08-28 DIAGNOSIS — J441 Chronic obstructive pulmonary disease with (acute) exacerbation: Secondary | ICD-10-CM | POA: Diagnosis not present

## 2017-08-28 DIAGNOSIS — E119 Type 2 diabetes mellitus without complications: Secondary | ICD-10-CM | POA: Diagnosis not present

## 2017-08-28 DIAGNOSIS — Z86711 Personal history of pulmonary embolism: Secondary | ICD-10-CM | POA: Diagnosis not present

## 2017-08-28 DIAGNOSIS — J189 Pneumonia, unspecified organism: Secondary | ICD-10-CM | POA: Diagnosis not present

## 2017-08-28 DIAGNOSIS — Z9981 Dependence on supplemental oxygen: Secondary | ICD-10-CM | POA: Diagnosis not present

## 2017-08-28 DIAGNOSIS — E785 Hyperlipidemia, unspecified: Secondary | ICD-10-CM | POA: Diagnosis not present

## 2017-08-29 DIAGNOSIS — J189 Pneumonia, unspecified organism: Secondary | ICD-10-CM | POA: Diagnosis not present

## 2017-08-29 DIAGNOSIS — Z9981 Dependence on supplemental oxygen: Secondary | ICD-10-CM | POA: Diagnosis not present

## 2017-08-29 DIAGNOSIS — Z86711 Personal history of pulmonary embolism: Secondary | ICD-10-CM | POA: Diagnosis not present

## 2017-08-29 DIAGNOSIS — E119 Type 2 diabetes mellitus without complications: Secondary | ICD-10-CM | POA: Diagnosis not present

## 2017-08-29 DIAGNOSIS — E785 Hyperlipidemia, unspecified: Secondary | ICD-10-CM | POA: Diagnosis not present

## 2017-08-29 DIAGNOSIS — J441 Chronic obstructive pulmonary disease with (acute) exacerbation: Secondary | ICD-10-CM | POA: Diagnosis not present

## 2017-08-30 DIAGNOSIS — E119 Type 2 diabetes mellitus without complications: Secondary | ICD-10-CM | POA: Diagnosis not present

## 2017-08-30 DIAGNOSIS — J441 Chronic obstructive pulmonary disease with (acute) exacerbation: Secondary | ICD-10-CM | POA: Diagnosis not present

## 2017-08-30 DIAGNOSIS — J189 Pneumonia, unspecified organism: Secondary | ICD-10-CM | POA: Diagnosis not present

## 2017-08-30 DIAGNOSIS — E785 Hyperlipidemia, unspecified: Secondary | ICD-10-CM | POA: Diagnosis not present

## 2017-08-30 DIAGNOSIS — Z86711 Personal history of pulmonary embolism: Secondary | ICD-10-CM | POA: Diagnosis not present

## 2017-08-30 DIAGNOSIS — Z9981 Dependence on supplemental oxygen: Secondary | ICD-10-CM | POA: Diagnosis not present

## 2017-08-31 DIAGNOSIS — J9611 Chronic respiratory failure with hypoxia: Secondary | ICD-10-CM | POA: Diagnosis not present

## 2017-08-31 DIAGNOSIS — J441 Chronic obstructive pulmonary disease with (acute) exacerbation: Secondary | ICD-10-CM | POA: Diagnosis not present

## 2017-08-31 DIAGNOSIS — Z09 Encounter for follow-up examination after completed treatment for conditions other than malignant neoplasm: Secondary | ICD-10-CM | POA: Diagnosis not present

## 2017-08-31 DIAGNOSIS — J189 Pneumonia, unspecified organism: Secondary | ICD-10-CM | POA: Diagnosis not present

## 2017-08-31 DIAGNOSIS — Z79899 Other long term (current) drug therapy: Secondary | ICD-10-CM | POA: Diagnosis not present

## 2017-09-01 DIAGNOSIS — J441 Chronic obstructive pulmonary disease with (acute) exacerbation: Secondary | ICD-10-CM | POA: Diagnosis not present

## 2017-09-01 DIAGNOSIS — E785 Hyperlipidemia, unspecified: Secondary | ICD-10-CM | POA: Diagnosis not present

## 2017-09-01 DIAGNOSIS — Z9981 Dependence on supplemental oxygen: Secondary | ICD-10-CM | POA: Diagnosis not present

## 2017-09-01 DIAGNOSIS — Z86711 Personal history of pulmonary embolism: Secondary | ICD-10-CM | POA: Diagnosis not present

## 2017-09-01 DIAGNOSIS — J189 Pneumonia, unspecified organism: Secondary | ICD-10-CM | POA: Diagnosis not present

## 2017-09-01 DIAGNOSIS — E119 Type 2 diabetes mellitus without complications: Secondary | ICD-10-CM | POA: Diagnosis not present

## 2017-09-04 DIAGNOSIS — E785 Hyperlipidemia, unspecified: Secondary | ICD-10-CM | POA: Diagnosis not present

## 2017-09-04 DIAGNOSIS — Z9981 Dependence on supplemental oxygen: Secondary | ICD-10-CM | POA: Diagnosis not present

## 2017-09-04 DIAGNOSIS — J189 Pneumonia, unspecified organism: Secondary | ICD-10-CM | POA: Diagnosis not present

## 2017-09-04 DIAGNOSIS — Z86711 Personal history of pulmonary embolism: Secondary | ICD-10-CM | POA: Diagnosis not present

## 2017-09-04 DIAGNOSIS — E119 Type 2 diabetes mellitus without complications: Secondary | ICD-10-CM | POA: Diagnosis not present

## 2017-09-04 DIAGNOSIS — J441 Chronic obstructive pulmonary disease with (acute) exacerbation: Secondary | ICD-10-CM | POA: Diagnosis not present

## 2017-09-05 DIAGNOSIS — Z86711 Personal history of pulmonary embolism: Secondary | ICD-10-CM | POA: Diagnosis not present

## 2017-09-05 DIAGNOSIS — E119 Type 2 diabetes mellitus without complications: Secondary | ICD-10-CM | POA: Diagnosis not present

## 2017-09-05 DIAGNOSIS — Z9981 Dependence on supplemental oxygen: Secondary | ICD-10-CM | POA: Diagnosis not present

## 2017-09-05 DIAGNOSIS — J441 Chronic obstructive pulmonary disease with (acute) exacerbation: Secondary | ICD-10-CM | POA: Diagnosis not present

## 2017-09-05 DIAGNOSIS — E785 Hyperlipidemia, unspecified: Secondary | ICD-10-CM | POA: Diagnosis not present

## 2017-09-05 DIAGNOSIS — J189 Pneumonia, unspecified organism: Secondary | ICD-10-CM | POA: Diagnosis not present

## 2017-09-06 DIAGNOSIS — Z86711 Personal history of pulmonary embolism: Secondary | ICD-10-CM | POA: Diagnosis not present

## 2017-09-06 DIAGNOSIS — J189 Pneumonia, unspecified organism: Secondary | ICD-10-CM | POA: Diagnosis not present

## 2017-09-06 DIAGNOSIS — Z9981 Dependence on supplemental oxygen: Secondary | ICD-10-CM | POA: Diagnosis not present

## 2017-09-06 DIAGNOSIS — J449 Chronic obstructive pulmonary disease, unspecified: Secondary | ICD-10-CM | POA: Diagnosis not present

## 2017-09-06 DIAGNOSIS — E785 Hyperlipidemia, unspecified: Secondary | ICD-10-CM | POA: Diagnosis not present

## 2017-09-06 DIAGNOSIS — E119 Type 2 diabetes mellitus without complications: Secondary | ICD-10-CM | POA: Diagnosis not present

## 2017-09-06 DIAGNOSIS — J441 Chronic obstructive pulmonary disease with (acute) exacerbation: Secondary | ICD-10-CM | POA: Diagnosis not present

## 2017-09-08 DIAGNOSIS — E119 Type 2 diabetes mellitus without complications: Secondary | ICD-10-CM | POA: Diagnosis not present

## 2017-09-08 DIAGNOSIS — J441 Chronic obstructive pulmonary disease with (acute) exacerbation: Secondary | ICD-10-CM | POA: Diagnosis not present

## 2017-09-08 DIAGNOSIS — J189 Pneumonia, unspecified organism: Secondary | ICD-10-CM | POA: Diagnosis not present

## 2017-09-08 DIAGNOSIS — E785 Hyperlipidemia, unspecified: Secondary | ICD-10-CM | POA: Diagnosis not present

## 2017-09-08 DIAGNOSIS — Z9981 Dependence on supplemental oxygen: Secondary | ICD-10-CM | POA: Diagnosis not present

## 2017-09-08 DIAGNOSIS — Z86711 Personal history of pulmonary embolism: Secondary | ICD-10-CM | POA: Diagnosis not present

## 2017-09-11 DIAGNOSIS — E785 Hyperlipidemia, unspecified: Secondary | ICD-10-CM | POA: Diagnosis not present

## 2017-09-11 DIAGNOSIS — E119 Type 2 diabetes mellitus without complications: Secondary | ICD-10-CM | POA: Diagnosis not present

## 2017-09-11 DIAGNOSIS — J441 Chronic obstructive pulmonary disease with (acute) exacerbation: Secondary | ICD-10-CM | POA: Diagnosis not present

## 2017-09-11 DIAGNOSIS — J189 Pneumonia, unspecified organism: Secondary | ICD-10-CM | POA: Diagnosis not present

## 2017-09-11 DIAGNOSIS — Z86711 Personal history of pulmonary embolism: Secondary | ICD-10-CM | POA: Diagnosis not present

## 2017-09-11 DIAGNOSIS — Z9981 Dependence on supplemental oxygen: Secondary | ICD-10-CM | POA: Diagnosis not present

## 2017-09-12 DIAGNOSIS — E119 Type 2 diabetes mellitus without complications: Secondary | ICD-10-CM | POA: Diagnosis not present

## 2017-09-12 DIAGNOSIS — J189 Pneumonia, unspecified organism: Secondary | ICD-10-CM | POA: Diagnosis not present

## 2017-09-12 DIAGNOSIS — Z86711 Personal history of pulmonary embolism: Secondary | ICD-10-CM | POA: Diagnosis not present

## 2017-09-12 DIAGNOSIS — J441 Chronic obstructive pulmonary disease with (acute) exacerbation: Secondary | ICD-10-CM | POA: Diagnosis not present

## 2017-09-12 DIAGNOSIS — Z9981 Dependence on supplemental oxygen: Secondary | ICD-10-CM | POA: Diagnosis not present

## 2017-09-12 DIAGNOSIS — E785 Hyperlipidemia, unspecified: Secondary | ICD-10-CM | POA: Diagnosis not present

## 2017-09-13 DIAGNOSIS — E119 Type 2 diabetes mellitus without complications: Secondary | ICD-10-CM | POA: Diagnosis not present

## 2017-09-13 DIAGNOSIS — J441 Chronic obstructive pulmonary disease with (acute) exacerbation: Secondary | ICD-10-CM | POA: Diagnosis not present

## 2017-09-13 DIAGNOSIS — Z9981 Dependence on supplemental oxygen: Secondary | ICD-10-CM | POA: Diagnosis not present

## 2017-09-13 DIAGNOSIS — Z86711 Personal history of pulmonary embolism: Secondary | ICD-10-CM | POA: Diagnosis not present

## 2017-09-13 DIAGNOSIS — E785 Hyperlipidemia, unspecified: Secondary | ICD-10-CM | POA: Diagnosis not present

## 2017-09-13 DIAGNOSIS — J189 Pneumonia, unspecified organism: Secondary | ICD-10-CM | POA: Diagnosis not present

## 2017-09-15 DIAGNOSIS — Z9981 Dependence on supplemental oxygen: Secondary | ICD-10-CM | POA: Diagnosis not present

## 2017-09-15 DIAGNOSIS — E119 Type 2 diabetes mellitus without complications: Secondary | ICD-10-CM | POA: Diagnosis not present

## 2017-09-15 DIAGNOSIS — E785 Hyperlipidemia, unspecified: Secondary | ICD-10-CM | POA: Diagnosis not present

## 2017-09-15 DIAGNOSIS — Z86711 Personal history of pulmonary embolism: Secondary | ICD-10-CM | POA: Diagnosis not present

## 2017-09-15 DIAGNOSIS — J441 Chronic obstructive pulmonary disease with (acute) exacerbation: Secondary | ICD-10-CM | POA: Diagnosis not present

## 2017-09-15 DIAGNOSIS — J189 Pneumonia, unspecified organism: Secondary | ICD-10-CM | POA: Diagnosis not present

## 2017-09-18 DIAGNOSIS — E785 Hyperlipidemia, unspecified: Secondary | ICD-10-CM | POA: Diagnosis not present

## 2017-09-18 DIAGNOSIS — E119 Type 2 diabetes mellitus without complications: Secondary | ICD-10-CM | POA: Diagnosis not present

## 2017-09-18 DIAGNOSIS — J441 Chronic obstructive pulmonary disease with (acute) exacerbation: Secondary | ICD-10-CM | POA: Diagnosis not present

## 2017-09-18 DIAGNOSIS — Z86711 Personal history of pulmonary embolism: Secondary | ICD-10-CM | POA: Diagnosis not present

## 2017-09-18 DIAGNOSIS — Z9981 Dependence on supplemental oxygen: Secondary | ICD-10-CM | POA: Diagnosis not present

## 2017-09-18 DIAGNOSIS — J189 Pneumonia, unspecified organism: Secondary | ICD-10-CM | POA: Diagnosis not present

## 2017-09-20 DIAGNOSIS — Z9981 Dependence on supplemental oxygen: Secondary | ICD-10-CM | POA: Diagnosis not present

## 2017-09-20 DIAGNOSIS — Z86711 Personal history of pulmonary embolism: Secondary | ICD-10-CM | POA: Diagnosis not present

## 2017-09-20 DIAGNOSIS — J189 Pneumonia, unspecified organism: Secondary | ICD-10-CM | POA: Diagnosis not present

## 2017-09-20 DIAGNOSIS — J441 Chronic obstructive pulmonary disease with (acute) exacerbation: Secondary | ICD-10-CM | POA: Diagnosis not present

## 2017-09-20 DIAGNOSIS — E119 Type 2 diabetes mellitus without complications: Secondary | ICD-10-CM | POA: Diagnosis not present

## 2017-09-20 DIAGNOSIS — E785 Hyperlipidemia, unspecified: Secondary | ICD-10-CM | POA: Diagnosis not present

## 2017-09-21 DIAGNOSIS — E119 Type 2 diabetes mellitus without complications: Secondary | ICD-10-CM | POA: Diagnosis not present

## 2017-09-21 DIAGNOSIS — J189 Pneumonia, unspecified organism: Secondary | ICD-10-CM | POA: Diagnosis not present

## 2017-09-21 DIAGNOSIS — Z9981 Dependence on supplemental oxygen: Secondary | ICD-10-CM | POA: Diagnosis not present

## 2017-09-21 DIAGNOSIS — Z86711 Personal history of pulmonary embolism: Secondary | ICD-10-CM | POA: Diagnosis not present

## 2017-09-21 DIAGNOSIS — E785 Hyperlipidemia, unspecified: Secondary | ICD-10-CM | POA: Diagnosis not present

## 2017-09-21 DIAGNOSIS — J441 Chronic obstructive pulmonary disease with (acute) exacerbation: Secondary | ICD-10-CM | POA: Diagnosis not present

## 2017-09-25 DIAGNOSIS — Z86711 Personal history of pulmonary embolism: Secondary | ICD-10-CM | POA: Diagnosis not present

## 2017-09-25 DIAGNOSIS — E785 Hyperlipidemia, unspecified: Secondary | ICD-10-CM | POA: Diagnosis not present

## 2017-09-25 DIAGNOSIS — J189 Pneumonia, unspecified organism: Secondary | ICD-10-CM | POA: Diagnosis not present

## 2017-09-25 DIAGNOSIS — E119 Type 2 diabetes mellitus without complications: Secondary | ICD-10-CM | POA: Diagnosis not present

## 2017-09-25 DIAGNOSIS — J441 Chronic obstructive pulmonary disease with (acute) exacerbation: Secondary | ICD-10-CM | POA: Diagnosis not present

## 2017-09-25 DIAGNOSIS — Z9981 Dependence on supplemental oxygen: Secondary | ICD-10-CM | POA: Diagnosis not present

## 2017-09-27 DIAGNOSIS — E785 Hyperlipidemia, unspecified: Secondary | ICD-10-CM | POA: Diagnosis not present

## 2017-09-27 DIAGNOSIS — Z86711 Personal history of pulmonary embolism: Secondary | ICD-10-CM | POA: Diagnosis not present

## 2017-09-27 DIAGNOSIS — E119 Type 2 diabetes mellitus without complications: Secondary | ICD-10-CM | POA: Diagnosis not present

## 2017-09-27 DIAGNOSIS — J189 Pneumonia, unspecified organism: Secondary | ICD-10-CM | POA: Diagnosis not present

## 2017-09-27 DIAGNOSIS — Z9981 Dependence on supplemental oxygen: Secondary | ICD-10-CM | POA: Diagnosis not present

## 2017-09-27 DIAGNOSIS — J441 Chronic obstructive pulmonary disease with (acute) exacerbation: Secondary | ICD-10-CM | POA: Diagnosis not present

## 2017-10-04 DIAGNOSIS — J449 Chronic obstructive pulmonary disease, unspecified: Secondary | ICD-10-CM | POA: Diagnosis not present

## 2017-10-20 DIAGNOSIS — R21 Rash and other nonspecific skin eruption: Secondary | ICD-10-CM | POA: Diagnosis not present

## 2017-11-04 DIAGNOSIS — J449 Chronic obstructive pulmonary disease, unspecified: Secondary | ICD-10-CM | POA: Diagnosis not present

## 2017-12-04 DIAGNOSIS — J449 Chronic obstructive pulmonary disease, unspecified: Secondary | ICD-10-CM | POA: Diagnosis not present

## 2017-12-21 DIAGNOSIS — H2513 Age-related nuclear cataract, bilateral: Secondary | ICD-10-CM | POA: Diagnosis not present

## 2018-01-04 DIAGNOSIS — J449 Chronic obstructive pulmonary disease, unspecified: Secondary | ICD-10-CM | POA: Diagnosis not present

## 2018-01-24 DIAGNOSIS — J449 Chronic obstructive pulmonary disease, unspecified: Secondary | ICD-10-CM | POA: Diagnosis not present

## 2018-01-24 DIAGNOSIS — N183 Chronic kidney disease, stage 3 (moderate): Secondary | ICD-10-CM | POA: Diagnosis not present

## 2018-01-24 DIAGNOSIS — E119 Type 2 diabetes mellitus without complications: Secondary | ICD-10-CM | POA: Diagnosis not present

## 2018-01-24 DIAGNOSIS — I10 Essential (primary) hypertension: Secondary | ICD-10-CM | POA: Diagnosis not present

## 2018-01-24 DIAGNOSIS — E782 Mixed hyperlipidemia: Secondary | ICD-10-CM | POA: Diagnosis not present

## 2018-01-24 DIAGNOSIS — E538 Deficiency of other specified B group vitamins: Secondary | ICD-10-CM | POA: Diagnosis not present

## 2018-01-24 DIAGNOSIS — J9611 Chronic respiratory failure with hypoxia: Secondary | ICD-10-CM | POA: Diagnosis not present

## 2018-02-03 DIAGNOSIS — J449 Chronic obstructive pulmonary disease, unspecified: Secondary | ICD-10-CM | POA: Diagnosis not present

## 2018-02-23 DIAGNOSIS — J449 Chronic obstructive pulmonary disease, unspecified: Secondary | ICD-10-CM | POA: Diagnosis not present

## 2018-03-26 DIAGNOSIS — J449 Chronic obstructive pulmonary disease, unspecified: Secondary | ICD-10-CM | POA: Diagnosis not present

## 2018-05-26 DIAGNOSIS — J449 Chronic obstructive pulmonary disease, unspecified: Secondary | ICD-10-CM | POA: Diagnosis not present

## 2018-06-11 ENCOUNTER — Other Ambulatory Visit: Payer: Self-pay

## 2018-06-11 NOTE — Patient Outreach (Signed)
[facility]) Care Management  06/11/2018  Pamela Cuevas 01-06-1936 [id number]   Medication Adherence call to Mrs. Pamela Cuevas spoke with patient she is due on Atorvastatin 10 mg , Glimepiride 2 mg and Lisinopril 10 mg. Pamela Cuevas pick all her medication Saturday. Pamela Cuevas is showing past due under [facility] [place].   [facility] Direct Dial [phone]  Fax [phone] [email or web address]

## 2018-06-25 DIAGNOSIS — J449 Chronic obstructive pulmonary disease, unspecified: Secondary | ICD-10-CM | POA: Diagnosis not present

## 2018-07-26 DIAGNOSIS — J449 Chronic obstructive pulmonary disease, unspecified: Secondary | ICD-10-CM | POA: Diagnosis not present

## 2018-08-02 DIAGNOSIS — J9611 Chronic respiratory failure with hypoxia: Secondary | ICD-10-CM | POA: Diagnosis not present

## 2018-08-02 DIAGNOSIS — I10 Essential (primary) hypertension: Secondary | ICD-10-CM | POA: Diagnosis not present

## 2018-08-02 DIAGNOSIS — E782 Mixed hyperlipidemia: Secondary | ICD-10-CM | POA: Diagnosis not present

## 2018-08-02 DIAGNOSIS — E119 Type 2 diabetes mellitus without complications: Secondary | ICD-10-CM | POA: Diagnosis not present

## 2018-08-02 DIAGNOSIS — N183 Chronic kidney disease, stage 3 (moderate): Secondary | ICD-10-CM | POA: Diagnosis not present

## 2018-08-13 DIAGNOSIS — R040 Epistaxis: Secondary | ICD-10-CM | POA: Diagnosis not present

## 2018-08-13 DIAGNOSIS — R5381 Other malaise: Secondary | ICD-10-CM | POA: Diagnosis not present

## 2019-09-24 DIAGNOSIS — J449 Chronic obstructive pulmonary disease, unspecified: Secondary | ICD-10-CM | POA: Diagnosis not present

## 2019-10-25 DIAGNOSIS — J449 Chronic obstructive pulmonary disease, unspecified: Secondary | ICD-10-CM | POA: Diagnosis not present

## 2019-11-24 DIAGNOSIS — J449 Chronic obstructive pulmonary disease, unspecified: Secondary | ICD-10-CM | POA: Diagnosis not present

## 2019-12-09 DIAGNOSIS — E782 Mixed hyperlipidemia: Secondary | ICD-10-CM | POA: Diagnosis not present

## 2019-12-09 DIAGNOSIS — J449 Chronic obstructive pulmonary disease, unspecified: Secondary | ICD-10-CM | POA: Diagnosis not present

## 2019-12-09 DIAGNOSIS — E1165 Type 2 diabetes mellitus with hyperglycemia: Secondary | ICD-10-CM | POA: Diagnosis not present

## 2019-12-09 DIAGNOSIS — N1832 Chronic kidney disease, stage 3b: Secondary | ICD-10-CM | POA: Diagnosis not present

## 2019-12-09 DIAGNOSIS — I10 Essential (primary) hypertension: Secondary | ICD-10-CM | POA: Diagnosis not present

## 2019-12-25 DIAGNOSIS — J449 Chronic obstructive pulmonary disease, unspecified: Secondary | ICD-10-CM | POA: Diagnosis not present

## 2019-12-27 DIAGNOSIS — E119 Type 2 diabetes mellitus without complications: Secondary | ICD-10-CM | POA: Diagnosis not present

## 2020-01-24 DIAGNOSIS — J449 Chronic obstructive pulmonary disease, unspecified: Secondary | ICD-10-CM | POA: Diagnosis not present

## 2020-02-24 DIAGNOSIS — J449 Chronic obstructive pulmonary disease, unspecified: Secondary | ICD-10-CM | POA: Diagnosis not present

## 2020-03-09 DIAGNOSIS — E782 Mixed hyperlipidemia: Secondary | ICD-10-CM | POA: Diagnosis not present

## 2020-03-09 DIAGNOSIS — I10 Essential (primary) hypertension: Secondary | ICD-10-CM | POA: Diagnosis not present

## 2020-03-09 DIAGNOSIS — E1165 Type 2 diabetes mellitus with hyperglycemia: Secondary | ICD-10-CM | POA: Diagnosis not present

## 2020-03-09 DIAGNOSIS — N184 Chronic kidney disease, stage 4 (severe): Secondary | ICD-10-CM | POA: Diagnosis not present

## 2020-03-09 DIAGNOSIS — J449 Chronic obstructive pulmonary disease, unspecified: Secondary | ICD-10-CM | POA: Diagnosis not present

## 2020-03-11 DIAGNOSIS — H2512 Age-related nuclear cataract, left eye: Secondary | ICD-10-CM | POA: Diagnosis not present

## 2020-03-11 DIAGNOSIS — J45909 Unspecified asthma, uncomplicated: Secondary | ICD-10-CM | POA: Diagnosis not present

## 2020-03-17 DIAGNOSIS — E875 Hyperkalemia: Secondary | ICD-10-CM | POA: Diagnosis not present

## 2020-03-24 ENCOUNTER — Other Ambulatory Visit
Admission: RE | Admit: 2020-03-24 | Discharge: 2020-03-24 | Disposition: A | Discharge status: Home / Self Care | Payer: Medicare Other | Source: Ambulatory Visit | Attending: Ophthalmology | Admitting: Ophthalmology

## 2020-03-24 ENCOUNTER — Other Ambulatory Visit: Payer: Self-pay

## 2020-03-24 ENCOUNTER — Encounter: Payer: Self-pay | Admitting: Ophthalmology

## 2020-03-24 DIAGNOSIS — Z01812 Encounter for preprocedural laboratory examination: Secondary | ICD-10-CM | POA: Diagnosis not present

## 2020-03-24 DIAGNOSIS — Z20822 Contact with and (suspected) exposure to covid-19: Secondary | ICD-10-CM | POA: Diagnosis not present

## 2020-03-24 NOTE — Discharge Instructions (Signed)

## 2020-03-24 NOTE — Anesthesia Preprocedure Evaluation (Deleted)
Anesthesia Evaluation    Airway        Dental   Pulmonary former smoker,           Cardiovascular hypertension,      Neuro/Psych    GI/Hepatic   Endo/Other  diabetes  Renal/GU      Musculoskeletal   Abdominal   Peds  Hematology   Anesthesia Other Findings   Reproductive/Obstetrics                             Anesthesia Physical Anesthesia Plan Anesthesia Quick Evaluation  

## 2020-03-25 LAB — SARS CORONAVIRUS 2 (TAT 6-24 HRS): SARS Coronavirus 2: NEGATIVE

## 2020-03-26 ENCOUNTER — Other Ambulatory Visit: Payer: Self-pay

## 2020-03-26 ENCOUNTER — Ambulatory Visit: Payer: Medicare Other | Admitting: Anesthesiology

## 2020-03-26 ENCOUNTER — Encounter: Payer: Self-pay | Admitting: Ophthalmology

## 2020-03-26 ENCOUNTER — Encounter
Admission: RE | Disposition: A | Discharge status: Home / Self Care | Payer: Self-pay | Source: Ambulatory Visit | Attending: Ophthalmology

## 2020-03-26 ENCOUNTER — Ambulatory Visit
Admission: RE | Admit: 2020-03-26 | Discharge: 2020-03-26 | Disposition: A | Discharge status: Home / Self Care | Payer: Medicare Other | Source: Ambulatory Visit | Attending: Ophthalmology | Admitting: Ophthalmology

## 2020-03-26 DIAGNOSIS — Z886 Allergy status to analgesic agent status: Secondary | ICD-10-CM | POA: Diagnosis not present

## 2020-03-26 DIAGNOSIS — Z79899 Other long term (current) drug therapy: Secondary | ICD-10-CM | POA: Insufficient documentation

## 2020-03-26 DIAGNOSIS — Z9981 Dependence on supplemental oxygen: Secondary | ICD-10-CM | POA: Diagnosis not present

## 2020-03-26 DIAGNOSIS — I10 Essential (primary) hypertension: Secondary | ICD-10-CM | POA: Diagnosis not present

## 2020-03-26 DIAGNOSIS — E1136 Type 2 diabetes mellitus with diabetic cataract: Secondary | ICD-10-CM | POA: Diagnosis not present

## 2020-03-26 DIAGNOSIS — J45909 Unspecified asthma, uncomplicated: Secondary | ICD-10-CM | POA: Diagnosis not present

## 2020-03-26 DIAGNOSIS — K219 Gastro-esophageal reflux disease without esophagitis: Secondary | ICD-10-CM | POA: Insufficient documentation

## 2020-03-26 DIAGNOSIS — J449 Chronic obstructive pulmonary disease, unspecified: Secondary | ICD-10-CM | POA: Diagnosis not present

## 2020-03-26 DIAGNOSIS — H2512 Age-related nuclear cataract, left eye: Secondary | ICD-10-CM | POA: Diagnosis not present

## 2020-03-26 DIAGNOSIS — E7800 Pure hypercholesterolemia, unspecified: Secondary | ICD-10-CM | POA: Insufficient documentation

## 2020-03-26 DIAGNOSIS — Z7984 Long term (current) use of oral hypoglycemic drugs: Secondary | ICD-10-CM | POA: Diagnosis not present

## 2020-03-26 DIAGNOSIS — Z86711 Personal history of pulmonary embolism: Secondary | ICD-10-CM | POA: Diagnosis not present

## 2020-03-26 DIAGNOSIS — H25812 Combined forms of age-related cataract, left eye: Secondary | ICD-10-CM | POA: Diagnosis not present

## 2020-03-26 HISTORY — DX: Type 2 diabetes mellitus without complications: E11.9

## 2020-03-26 HISTORY — PX: CATARACT EXTRACTION W/PHACO: SHX586

## 2020-03-26 HISTORY — DX: Essential (primary) hypertension: I10

## 2020-03-26 LAB — GLUCOSE, CAPILLARY
Glucose-Capillary: 124 mg/dL — ABNORMAL HIGH (ref 70–99)
Glucose-Capillary: 127 mg/dL — ABNORMAL HIGH (ref 70–99)

## 2020-03-26 SURGERY — PHACOEMULSIFICATION, CATARACT, WITH IOL INSERTION
Anesthesia: Monitor Anesthesia Care | Site: Eye | Laterality: Left

## 2020-03-26 MED ORDER — BRIMONIDINE TARTRATE-TIMOLOL 0.2-0.5 % OP SOLN
OPHTHALMIC | Status: DC | PRN
  Administered 2020-03-26: 1 [drp] via OPHTHALMIC

## 2020-03-26 MED ORDER — LACTATED RINGERS IV SOLN: INTRAVENOUS | Status: DC

## 2020-03-26 MED ORDER — EPINEPHRINE PF 1 MG/ML IJ SOLN
INTRAOCULAR | Status: DC | PRN
  Administered 2020-03-26: 60 mL via OPHTHALMIC

## 2020-03-26 MED ORDER — FENTANYL CITRATE (PF) 100 MCG/2ML IJ SOLN
INTRAMUSCULAR | Status: DC | PRN
Start: 2020-03-26 — End: 2020-03-26
  Administered 2020-03-26: 25 ug via INTRAVENOUS

## 2020-03-26 MED ORDER — MIDAZOLAM HCL 2 MG/2ML IJ SOLN
INTRAMUSCULAR | Status: DC | PRN
  Administered 2020-03-26: 1 mg via INTRAVENOUS

## 2020-03-26 MED ORDER — MOXIFLOXACIN HCL 0.5 % OP SOLN
OPHTHALMIC | Status: DC | PRN
  Administered 2020-03-26: 0.2 mL via OPHTHALMIC

## 2020-03-26 MED ORDER — LIDOCAINE HCL (PF) 2 % IJ SOLN
INTRAOCULAR | Status: DC | PRN
  Administered 2020-03-26: 1 mL

## 2020-03-26 MED ORDER — TETRACAINE HCL 0.5 % OP SOLN
1.0000 [drp] | OPHTHALMIC | Status: DC | PRN
  Administered 2020-03-26 (×3): 1 [drp] via OPHTHALMIC

## 2020-03-26 MED ORDER — ARMC OPHTHALMIC DILATING DROPS
1.0000 "application " | OPHTHALMIC | Status: DC | PRN
  Administered 2020-03-26 (×3): 1 via OPHTHALMIC

## 2020-03-26 MED ORDER — NA CHONDROIT SULF-NA HYALURON 40-17 MG/ML IO SOLN
INTRAOCULAR | Status: DC | PRN
  Administered 2020-03-26: 1 mL via INTRAOCULAR

## 2020-03-26 SURGICAL SUPPLY — 21 items
CANNULA ANT/CHMB 27G (MISCELLANEOUS) ×2 IMPLANT
CANNULA ANT/CHMB 27GA (MISCELLANEOUS) ×6 IMPLANT
GLOVE SURG LX 8.0 MICRO (GLOVE) ×4
GLOVE SURG LX STRL 8.0 MICRO (GLOVE) ×1 IMPLANT
GLOVE SURG TRIUMPH 8.0 PF LTX (GLOVE) ×3 IMPLANT
GOWN STRL REUS W/ TWL LRG LVL3 (GOWN DISPOSABLE) ×2 IMPLANT
GOWN STRL REUS W/TWL LRG LVL3 (GOWN DISPOSABLE) ×6
LENS IOL TECNIS EYHANCE 33.0 ×2 IMPLANT
MARKER SKIN DUAL TIP RULER LAB (MISCELLANEOUS) ×3 IMPLANT
NDL FILTER BLUNT 18X1 1/2 (NEEDLE) ×1 IMPLANT
NEEDLE FILTER BLUNT 18X 1/2SAF (NEEDLE) ×2
NEEDLE FILTER BLUNT 18X1 1/2 (NEEDLE) ×1 IMPLANT
PACK EYE AFTER SURG (MISCELLANEOUS) ×3 IMPLANT
PACK OPTHALMIC (MISCELLANEOUS) ×3 IMPLANT
PACK PORFILIO (MISCELLANEOUS) ×3 IMPLANT
SUT ETHILON 10-0 CS-B-6CS-B-6 (SUTURE)
SUTURE EHLN 10-0 CS-B-6CS-B-6 (SUTURE) IMPLANT
SYR 3ML LL SCALE MARK (SYRINGE) ×3 IMPLANT
SYR TB 1ML LUER SLIP (SYRINGE) ×3 IMPLANT
WATER STERILE IRR 250ML POUR (IV SOLUTION) ×3 IMPLANT
WIPE NON LINTING 3.25X3.25 (MISCELLANEOUS) ×3 IMPLANT

## 2020-03-26 NOTE — Op Note (Signed)
PREOPERATIVE DIAGNOSIS:  Nuclear sclerotic cataract of the left eye.   POSTOPERATIVE DIAGNOSIS:  Nuclear sclerotic cataract of the left eye.   OPERATIVE PROCEDURE:@   SURGEON:  Birder Robson, MD.   ANESTHESIA:  Anesthesiologist: Elgie Collard, MD CRNA: Mayme Genta, CRNA  1.      Managed anesthesia care. 2.     0.15ml of Shugarcaine was instilled following the paracentesis   COMPLICATIONS:  None.   TECHNIQUE:   Stop and chop   DESCRIPTION OF PROCEDURE:  The patient was examined and consented in the preoperative holding area where the aforementioned topical anesthesia was applied to the left eye and then brought back to the Operating Room where the left eye was prepped and draped in the usual sterile ophthalmic fashion and a lid speculum was placed. A paracentesis was created with the side port blade and the anterior chamber was filled with viscoelastic. A near clear corneal incision was performed with the steel keratome. A continuous curvilinear capsulorrhexis was performed with a cystotome followed by the capsulorrhexis forceps. Hydrodissection and hydrodelineation were carried out with BSS on a blunt cannula. The lens was removed in a stop and chop  technique and the remaining cortical material was removed with the irrigation-aspiration handpiece. The capsular bag was inflated with viscoelastic and the Technis [id number] lens was placed in the capsular bag without complication. The remaining viscoelastic was removed from the eye with the irrigation-aspiration handpiece. The wounds were hydrated. The anterior chamber was flushed with BSS and the eye was inflated to physiologic pressure. 0.82ml Vigamox was placed in the anterior chamber. The wounds were found to be water tight. The eye was dressed with Combigan. The patient was given protective glasses to wear throughout the day and a shield with which to sleep tonight. The patient was also given drops with which to begin a drop regimen today and  will follow-up with me in one day. Implant Name Type Inv. Item Serial No. Manufacturer Lot No. LRB No. Used Action  TECNIS SIMPLICITY  EYHANCE IOL Intraocular Lens  [id number] [employer]  Left 1 Implanted    Procedure(s) with comments: CATARACT EXTRACTION PHACO AND INTRAOCULAR LENS PLACEMENT (IOC) LEFT DIABETIC (Left) - 7.89 0:44.3  Electronically signed: Birder Robson 03/26/2020 10:05 AM

## 2020-03-26 NOTE — Transfer of Care (Signed)
Immediate Anesthesia Transfer of Care Note  Patient: Pamela Cuevas  Procedure(s) Performed: CATARACT EXTRACTION PHACO AND INTRAOCULAR LENS PLACEMENT ([facility]) LEFT DIABETIC (Left Eye)  Patient Location: PACU  Anesthesia Type: MAC  Level of Consciousness: awake, alert  and patient cooperative  Airway and Oxygen Therapy: Patient Spontanous Breathing and Patient connected to supplemental oxygen  Post-op Assessment: Post-op Vital signs reviewed, Patient's Cardiovascular Status Stable, Respiratory Function Stable, Patent Airway and No signs of Nausea or vomiting  Post-op Vital Signs: Reviewed and stable  Complications: No complications documented.

## 2020-03-26 NOTE — Anesthesia Procedure Notes (Signed)
Procedure Name: MAC Performed by: Artemus Romanoff, CRNA Pre-anesthesia Checklist: Patient identified, Emergency Drugs available, Suction available, Timeout performed and Patient being monitored Patient Re-evaluated:Patient Re-evaluated prior to induction Oxygen Delivery Method: Nasal cannula Placement Confirmation: positive ETCO2       

## 2020-03-26 NOTE — Anesthesia Postprocedure Evaluation (Signed)
Anesthesia Post Note  Patient: Pamela Cuevas  Procedure(s) Performed: CATARACT EXTRACTION PHACO AND INTRAOCULAR LENS PLACEMENT (I[facility]) LEFT DIABETIC (Left Eye)     Patient location during evaluation: PACU Anesthesia Type: MAC Level of consciousness: awake and alert Pain management: pain level controlled Vital Signs Assessment: post-procedure vital signs reviewed and stable Respiratory status: spontaneous breathing Cardiovascular status: stable Postop Assessment: no headache Anesthetic complications: no   No complications documented.  Gillian Scarce

## 2020-03-26 NOTE — H&P (Signed)
All labs reviewed. Abnormal studies sent to patients PCP when indicated.  Previous H&P reviewed, patient examined, there are NO CHANGES.  Pamela Smelcer Porfilio9/9/20219:10 AM

## 2020-03-26 NOTE — Anesthesia Preprocedure Evaluation (Addendum)
Anesthesia Evaluation  Patient identified by MRN, date of birth, ID band Patient awake    Reviewed: Allergy & Precautions, H&P , NPO status , Patient's Chart, lab work & pertinent test results  History of Anesthesia Complications Negative for: history of anesthetic complications  Airway Mallampati: II  TM Distance: >3 FB Neck ROM: full    Dental no notable dental hx.    Pulmonary former smoker,  On first arrival O2 sats were in the upper 70s and low 80s.  She did not appear labored and was clear to auscultation.  She said that she has oxygen at home that she uses at night and sometimes during the day.  After putting oxygen 2 L [place] on here, her sats are 95-100%.  I suspect this is just chronic lung disease and she likely needs oxygen more frequently and with any exertion at home.  Ok to proceed.   Pulmonary exam normal breath sounds clear to auscultation       Cardiovascular hypertension, On Medications Normal cardiovascular exam Rhythm:regular Rate:Normal     Neuro/Psych negative neurological ROS     GI/Hepatic negative GI ROS, Neg liver ROS,   Endo/Other  diabetes, Well Controlled, Type 2  Renal/GU negative Renal ROS  negative genitourinary   Musculoskeletal   Abdominal   Peds  Hematology negative hematology ROS (+)   Anesthesia Other Findings   Reproductive/Obstetrics                            Anesthesia Physical Anesthesia Plan  ASA: III  Anesthesia Plan: MAC   Post-op Pain Management:    Induction:   PONV Risk Score and Plan: 2 and Ondansetron  Airway Management Planned:   Additional Equipment:   Intra-op Plan:   Post-operative Plan:   Informed Consent: I have reviewed the patients History and Physical, chart, labs and discussed the procedure including the risks, benefits and alternatives for the proposed anesthesia with the patient or authorized representative who has  indicated his/her understanding and acceptance.       Plan Discussed with:   Anesthesia Plan Comments:         Anesthesia Quick Evaluation

## 2020-03-27 ENCOUNTER — Encounter: Payer: Self-pay | Admitting: Ophthalmology

## 2020-04-07 DIAGNOSIS — E875 Hyperkalemia: Secondary | ICD-10-CM | POA: Diagnosis not present

## 2020-04-10 DIAGNOSIS — E1159 Type 2 diabetes mellitus with other circulatory complications: Secondary | ICD-10-CM | POA: Diagnosis not present

## 2020-04-10 DIAGNOSIS — H2511 Age-related nuclear cataract, right eye: Secondary | ICD-10-CM | POA: Diagnosis not present

## 2020-04-13 DIAGNOSIS — Z0000 Encounter for general adult medical examination without abnormal findings: Secondary | ICD-10-CM | POA: Diagnosis not present

## 2020-04-13 DIAGNOSIS — E785 Hyperlipidemia, unspecified: Secondary | ICD-10-CM | POA: Diagnosis not present

## 2020-04-13 DIAGNOSIS — Z9181 History of falling: Secondary | ICD-10-CM | POA: Diagnosis not present

## 2020-04-15 ENCOUNTER — Encounter: Payer: Self-pay | Admitting: Ophthalmology

## 2020-04-15 ENCOUNTER — Other Ambulatory Visit: Payer: Self-pay

## 2020-04-17 ENCOUNTER — Other Ambulatory Visit: Payer: Self-pay

## 2020-04-17 ENCOUNTER — Other Ambulatory Visit
Admission: RE | Admit: 2020-04-17 | Discharge: 2020-04-17 | Disposition: A | Discharge status: Home / Self Care | Payer: Medicare Other | Source: Ambulatory Visit | Attending: Ophthalmology | Admitting: Ophthalmology

## 2020-04-17 DIAGNOSIS — Z20822 Contact with and (suspected) exposure to covid-19: Secondary | ICD-10-CM | POA: Insufficient documentation

## 2020-04-17 DIAGNOSIS — Z01812 Encounter for preprocedural laboratory examination: Secondary | ICD-10-CM | POA: Diagnosis not present

## 2020-04-17 NOTE — Discharge Instructions (Signed)

## 2020-04-18 LAB — SARS CORONAVIRUS 2 (TAT 6-24 HRS): SARS Coronavirus 2: NEGATIVE

## 2020-04-21 ENCOUNTER — Ambulatory Visit
Admission: RE | Admit: 2020-04-21 | Discharge: 2020-04-21 | Disposition: A | Discharge status: Home / Self Care | Payer: Medicare Other | Source: Ambulatory Visit | Attending: Ophthalmology | Admitting: Ophthalmology

## 2020-04-21 ENCOUNTER — Other Ambulatory Visit: Payer: Self-pay

## 2020-04-21 ENCOUNTER — Ambulatory Visit: Payer: Medicare Other | Admitting: Anesthesiology

## 2020-04-21 ENCOUNTER — Encounter: Payer: Self-pay | Admitting: Ophthalmology

## 2020-04-21 ENCOUNTER — Encounter
Admission: RE | Disposition: A | Discharge status: Home / Self Care | Payer: Self-pay | Source: Ambulatory Visit | Attending: Ophthalmology

## 2020-04-21 DIAGNOSIS — Z79899 Other long term (current) drug therapy: Secondary | ICD-10-CM | POA: Diagnosis not present

## 2020-04-21 DIAGNOSIS — E1136 Type 2 diabetes mellitus with diabetic cataract: Secondary | ICD-10-CM | POA: Diagnosis not present

## 2020-04-21 DIAGNOSIS — H2511 Age-related nuclear cataract, right eye: Secondary | ICD-10-CM | POA: Diagnosis not present

## 2020-04-21 DIAGNOSIS — Z7984 Long term (current) use of oral hypoglycemic drugs: Secondary | ICD-10-CM | POA: Insufficient documentation

## 2020-04-21 DIAGNOSIS — Z86711 Personal history of pulmonary embolism: Secondary | ICD-10-CM | POA: Diagnosis not present

## 2020-04-21 DIAGNOSIS — Z87891 Personal history of nicotine dependence: Secondary | ICD-10-CM | POA: Diagnosis not present

## 2020-04-21 DIAGNOSIS — I10 Essential (primary) hypertension: Secondary | ICD-10-CM | POA: Diagnosis not present

## 2020-04-21 DIAGNOSIS — H25811 Combined forms of age-related cataract, right eye: Secondary | ICD-10-CM | POA: Diagnosis not present

## 2020-04-21 DIAGNOSIS — Z7951 Long term (current) use of inhaled steroids: Secondary | ICD-10-CM | POA: Insufficient documentation

## 2020-04-21 DIAGNOSIS — Z7982 Long term (current) use of aspirin: Secondary | ICD-10-CM | POA: Insufficient documentation

## 2020-04-21 DIAGNOSIS — Z886 Allergy status to analgesic agent status: Secondary | ICD-10-CM | POA: Insufficient documentation

## 2020-04-21 DIAGNOSIS — Z96659 Presence of unspecified artificial knee joint: Secondary | ICD-10-CM | POA: Insufficient documentation

## 2020-04-21 HISTORY — PX: CATARACT EXTRACTION W/PHACO: SHX586

## 2020-04-21 LAB — GLUCOSE, CAPILLARY
Glucose-Capillary: 132 mg/dL — ABNORMAL HIGH (ref 70–99)
Glucose-Capillary: 130 mg/dL — ABNORMAL HIGH (ref 70–99)

## 2020-04-21 SURGERY — PHACOEMULSIFICATION, CATARACT, WITH IOL INSERTION
Anesthesia: Monitor Anesthesia Care | Site: Eye | Laterality: Right

## 2020-04-21 MED ORDER — BRIMONIDINE TARTRATE-TIMOLOL 0.2-0.5 % OP SOLN
OPHTHALMIC | Status: DC | PRN
  Administered 2020-04-21: 1 [drp] via OPHTHALMIC

## 2020-04-21 MED ORDER — LIDOCAINE HCL (PF) 2 % IJ SOLN
INTRAOCULAR | Status: DC | PRN
  Administered 2020-04-21: 2 mL

## 2020-04-21 MED ORDER — EPINEPHRINE PF 1 MG/ML IJ SOLN
INTRAOCULAR | Status: DC | PRN
  Administered 2020-04-21: 107 mL via OPHTHALMIC

## 2020-04-21 MED ORDER — NA CHONDROIT SULF-NA HYALURON 40-17 MG/ML IO SOLN
INTRAOCULAR | Status: DC | PRN
  Administered 2020-04-21: 1 mL via INTRAOCULAR

## 2020-04-21 MED ORDER — FENTANYL CITRATE (PF) 100 MCG/2ML IJ SOLN
INTRAMUSCULAR | Status: DC | PRN
  Administered 2020-04-21: 25 ug via INTRAVENOUS

## 2020-04-21 MED ORDER — ARMC OPHTHALMIC DILATING DROPS
1.0000 "application " | OPHTHALMIC | Status: DC | PRN
  Administered 2020-04-21 (×3): 1 via OPHTHALMIC

## 2020-04-21 MED ORDER — MOXIFLOXACIN HCL 0.5 % OP SOLN
OPHTHALMIC | Status: DC | PRN
  Administered 2020-04-21: 0.2 mL via OPHTHALMIC

## 2020-04-21 MED ORDER — MIDAZOLAM HCL 2 MG/2ML IJ SOLN
INTRAMUSCULAR | Status: DC | PRN
  Administered 2020-04-21: 1 mg via INTRAVENOUS

## 2020-04-21 MED ORDER — TETRACAINE HCL 0.5 % OP SOLN
1.0000 [drp] | OPHTHALMIC | Status: DC | PRN
  Administered 2020-04-21 (×3): 1 [drp] via OPHTHALMIC

## 2020-04-21 SURGICAL SUPPLY — 19 items
CANNULA ANT/CHMB 27G (MISCELLANEOUS) ×2 IMPLANT
CANNULA ANT/CHMB 27GA (MISCELLANEOUS) ×6 IMPLANT
GLOVE SURG LX 8.0 MICRO (GLOVE) ×2
GLOVE SURG LX STRL 8.0 MICRO (GLOVE) ×1 IMPLANT
GLOVE SURG TRIUMPH 8.0 PF LTX (GLOVE) ×3 IMPLANT
GOWN STRL REUS W/ TWL LRG LVL3 (GOWN DISPOSABLE) ×2 IMPLANT
GOWN STRL REUS W/TWL LRG LVL3 (GOWN DISPOSABLE) ×6
LENS IOL TECNIS EYHANCE 32.0 (Intraocular Lens) ×2 IMPLANT
MARKER SKIN DUAL TIP RULER LAB (MISCELLANEOUS) ×3 IMPLANT
NDL FILTER BLUNT 18X1 1/2 (NEEDLE) ×1 IMPLANT
NEEDLE FILTER BLUNT 18X 1/2SAF (NEEDLE) ×2
NEEDLE FILTER BLUNT 18X1 1/2 (NEEDLE) ×1 IMPLANT
PACK EYE AFTER SURG (MISCELLANEOUS) ×3 IMPLANT
PACK OPTHALMIC (MISCELLANEOUS) ×3 IMPLANT
PACK PORFILIO (MISCELLANEOUS) ×3 IMPLANT
SYR 3ML LL SCALE MARK (SYRINGE) ×3 IMPLANT
SYR TB 1ML LUER SLIP (SYRINGE) ×3 IMPLANT
WATER STERILE IRR 250ML POUR (IV SOLUTION) ×3 IMPLANT
WIPE NON LINTING 3.25X3.25 (MISCELLANEOUS) ×3 IMPLANT

## 2020-04-21 NOTE — H&P (Signed)
[facility]   Primary Care Physician:  Leonides Sake, MD Ophthalmologist: Dr. George Ina  Pre-Procedure History & Physical: HPI:  Pamela Cuevas is a 84 y.o. female here for cataract surgery.   Past Medical History:  Diagnosis Date  . Diabetes mellitus, type 2 ([place])   . Hypertension   . PE (pulmonary thromboembolism) ([place]) 2010   After knee replacement    Past Surgical History:  Procedure Laterality Date  . CATARACT EXTRACTION W/PHACO Left 03/26/2020   Procedure: CATARACT EXTRACTION PHACO AND INTRAOCULAR LENS PLACEMENT ([place]) LEFT DIABETIC;  Surgeon: Birder Robson, MD;  Location: [facility];  Service: Ophthalmology;  Laterality: Left;  7.89 0:44.3  . REPLACEMENT TOTAL KNEE Left 2010    Prior to Admission medications   Medication Sig Start Date End Date Taking? Authorizing Provider  albuterol (VENTOLIN HFA) 108 (90 Base) MCG/ACT inhaler Inhale 1-2 puffs into the lungs every 6 (six) hours as needed for wheezing or shortness of breath.   Yes [provider]  atorvastatin (LIPITOR) 10 MG tablet Take 5 mg by mouth daily.    Yes [provider]  ergocalciferol (VITAMIN D2) 1.25 MG (50000 UT) capsule Take 50,000 Units by mouth every Tuesday.   Yes [provider]  Fluticasone-Salmeterol (ADVAIR DISKUS IN) Inhale into the lungs as needed.   Yes [provider]  furosemide (LASIX) 40 MG tablet Take 40 mg by mouth.   Yes [provider]  methocarbamol (ROBAXIN) 500 MG tablet Take 500 mg by mouth as needed for muscle spasms.   Yes [provider]  pioglitazone (ACTOS) 30 MG tablet Take 30 mg by mouth daily.   Yes [provider]  lisinopril (ZESTRIL) 5 MG tablet Take 5 mg by mouth daily. Patient not taking: Reported on 03/24/2020    [provider]    Allergies as of 04/08/2020 - Review Complete 03/26/2020  Allergen Reaction Noted  . Aspirin Nausea Only 08/22/2017    History reviewed. No  pertinent family history.  Social History   Socioeconomic History  . Marital status: Married    Spouse name: Not on file  . Number of children: Not on file  . Years of education: Not on file  . Highest education level: Not on file  Occupational History  . Not on file  Tobacco Use  . Smoking status: Former Smoker    Types: Cigarettes    Quit date: 2003    Years since quitting: 18.7  . Smokeless tobacco: Never Used  Vaping Use  . Vaping Use: Never used  Substance and Sexual Activity  . Alcohol use: Not Currently  . Drug use: Not on file  . Sexual activity: Not on file  Other Topics Concern  . Not on file  Social History Narrative  . Not on file   Social Determinants of Health   Financial Resource Strain:   . Difficulty of Paying Living Expenses: Not on file  Food Insecurity:   . Worried About Charity fundraiser in the Last Year: Not on file  . Ran Out of Food in the Last Year: Not on file  Transportation Needs:   . Lack of Transportation (Medical): Not on file  . Lack of Transportation (Non-Medical): Not on file  Physical Activity:   . Days of Exercise per Week: Not on file  . Minutes of Exercise per Session: Not on file  Stress:   . Feeling of Stress : Not on file  Social Connections:   . Frequency  of Communication with Friends and Family: Not on file  . Frequency of Social Gatherings with Friends and Family: Not on file  . Attends Religious Services: Not on file  . Active Member of Clubs or Organizations: Not on file  . Attends Archivist Meetings: Not on file  . Marital Status: Not on file  Intimate Partner Violence:   . Fear of Current or Ex-Partner: Not on file  . Emotionally Abused: Not on file  . Physically Abused: Not on file  . Sexually Abused: Not on file    Review of Systems: See HPI, otherwise negative ROS  Physical Exam: BP (!) 158/62   Pulse 82   Temp (!) 97.2 F (36.2 C) (Temporal)   Ht 5\' 3"  (1.6 m)   Wt 104.3 kg   SpO2 94%    BMI 40.74 kg/m  General:   Alert,  pleasant and cooperative in NAD Head:  Normocephalic and atraumatic. Lungs:  Clear to auscultation.    Heart:  Regular rate and rhythm.   Impression/Plan: Pamela Cuevas is here for cataract surgery.  Risks, benefits, limitations, and alternatives regarding cataract surgery have been reviewed with the patient.  Questions have been answered.  All parties agreeable.   Birder Robson, MD  04/21/2020, 9:08 AM

## 2020-04-21 NOTE — Op Note (Signed)
PREOPERATIVE DIAGNOSIS:  Nuclear sclerotic cataract of the right eye.   POSTOPERATIVE DIAGNOSIS:  Cataract   OPERATIVE PROCEDURE: Procedure(s): CATARACT EXTRACTION PHACO AND INTRAOCULAR LENS PLACEMENT ([facility]) RIGHT DIABETIC 7.50 01:12.5   SURGEON:  Birder Robson, MD.   ANESTHESIA:  Anesthesiologist: Veda Canning, MD CRNA: Cameron Ali, CRNA  1.      Managed anesthesia care. 2.      0.5ml of Shugarcaine was instilled in the eye following the paracentesis.   COMPLICATIONS:  None.   TECHNIQUE:   Stop and chop   DESCRIPTION OF PROCEDURE:  The patient was examined and consented in the preoperative holding area where the aforementioned topical anesthesia was applied to the right eye and then brought back to the Operating Room where the right eye was prepped and draped in the usual sterile ophthalmic fashion and a lid speculum was placed. A paracentesis was created with the side port blade and the anterior chamber was filled with viscoelastic. A near clear corneal incision was performed with the steel keratome. A continuous curvilinear capsulorrhexis was performed with a cystotome followed by the capsulorrhexis forceps. Hydrodissection and hydrodelineation were carried out with BSS on a blunt cannula. The lens was removed in a stop and chop  technique and the remaining cortical material was removed with the irrigation-aspiration handpiece. The capsular bag was inflated with viscoelastic and the Technis [id number]  lens was placed in the capsular bag without complication. The remaining viscoelastic was removed from the eye with the irrigation-aspiration handpiece. The wounds were hydrated. The anterior chamber was flushed with Miostat and the eye was inflated to physiologic pressure. 0.1ml of Vigamox was placed in the anterior chamber. The wounds were found to be water tight. The eye was dressed with Vigamox. The patient was given protective glasses to wear throughout the day and a shield with which to  sleep tonight. The patient was also given drops with which to begin a drop regimen today and will follow-up with me in one day. Implant Name Type Inv. Item Serial No. Manufacturer Lot No. LRB No. Used Action  LENS II EYHANCE 32.0 - [id number] Intraocular Lens LENS II EYHANCE 32.0 [id number] [employer]   Right 1 Implanted   Procedure(s) with comments: CATARACT EXTRACTION PHACO AND INTRAOCULAR LENS PLACEMENT (I[facility]) RIGHT DIABETIC 7.50 01:12.5 (Right) - Diabetic - oral meds  Electronically signed: Birder Robson 04/21/2020 9:39 AM

## 2020-04-21 NOTE — Anesthesia Postprocedure Evaluation (Signed)
Anesthesia Post Note  Patient: Pamela Cuevas  Procedure(s) Performed: CATARACT EXTRACTION PHACO AND INTRAOCULAR LENS PLACEMENT (I[facility]) RIGHT DIABETIC 7.50 01:12.5 (Right Eye)     Patient location during evaluation: PACU Anesthesia Type: MAC Level of consciousness: awake Pain management: pain level controlled Vital Signs Assessment: post-procedure vital signs reviewed and stable Respiratory status: respiratory function stable Cardiovascular status: stable Postop Assessment: no apparent nausea or vomiting Anesthetic complications: no   No complications documented.  Veda Canning

## 2020-04-21 NOTE — Anesthesia Preprocedure Evaluation (Addendum)
Anesthesia Evaluation  Patient identified by MRN, date of birth, ID band Patient awake    Reviewed: Allergy & Precautions, H&P , NPO status   History of Anesthesia Complications Negative for: history of anesthetic complications  Airway Mallampati: II  TM Distance: >3 FB Neck ROM: full    Dental no notable dental hx.    Pulmonary COPD, former smoker,  O2 sats on arrival 85% - patient uses O2 at home. Not dyspneic.    breath sounds clear to auscultation       Cardiovascular hypertension, On Medications  Rhythm:regular Rate:Normal     Neuro/Psych    GI/Hepatic negative GI ROS,   Endo/Other  diabetes, Well Controlled, Type 2Morbid obesity  Renal/GU      Musculoskeletal   Abdominal   Peds  Hematology   Anesthesia Other Findings   Reproductive/Obstetrics                           Anesthesia Physical  Anesthesia Plan  ASA: IV  Anesthesia Plan: MAC   Post-op Pain Management:    Induction:   PONV Risk Score and Plan: 2 and Ondansetron  Airway Management Planned:   Additional Equipment:   Intra-op Plan:   Post-operative Plan:   Informed Consent: I have reviewed the patients History and Physical, chart, labs and discussed the procedure including the risks, benefits and alternatives for the proposed anesthesia with the patient or authorized representative who has indicated his/her understanding and acceptance.       Plan Discussed with: CRNA  Anesthesia Plan Comments:         Anesthesia Quick Evaluation

## 2020-04-21 NOTE — Transfer of Care (Signed)
Immediate Anesthesia Transfer of Care Note  Patient: Pamela Cuevas  Procedure(s) Performed: CATARACT EXTRACTION PHACO AND INTRAOCULAR LENS PLACEMENT ([facility]) RIGHT DIABETIC 7.50 01:12.5 (Right Eye)  Patient Location: PACU  Anesthesia Type: MAC  Level of Consciousness: awake, alert  and patient cooperative  Airway and Oxygen Therapy: Patient Spontanous Breathing and Patient connected to supplemental oxygen  Post-op Assessment: Post-op Vital signs reviewed, Patient's Cardiovascular Status Stable, Respiratory Function Stable, Patent Airway and No signs of Nausea or vomiting  Post-op Vital Signs: Reviewed and stable  Complications: No complications documented.

## 2020-04-22 ENCOUNTER — Encounter: Payer: Self-pay | Admitting: Ophthalmology

## 2020-04-25 DIAGNOSIS — J449 Chronic obstructive pulmonary disease, unspecified: Secondary | ICD-10-CM | POA: Diagnosis not present

## 2020-05-05 DIAGNOSIS — Z7984 Long term (current) use of oral hypoglycemic drugs: Secondary | ICD-10-CM | POA: Diagnosis not present

## 2020-05-05 DIAGNOSIS — E119 Type 2 diabetes mellitus without complications: Secondary | ICD-10-CM | POA: Diagnosis not present

## 2020-05-05 DIAGNOSIS — R0989 Other specified symptoms and signs involving the circulatory and respiratory systems: Secondary | ICD-10-CM | POA: Diagnosis not present

## 2020-05-05 DIAGNOSIS — R21 Rash and other nonspecific skin eruption: Secondary | ICD-10-CM | POA: Diagnosis not present

## 2020-05-05 DIAGNOSIS — Z9981 Dependence on supplemental oxygen: Secondary | ICD-10-CM | POA: Diagnosis not present

## 2020-05-05 DIAGNOSIS — R059 Cough, unspecified: Secondary | ICD-10-CM | POA: Diagnosis not present

## 2020-05-05 DIAGNOSIS — J90 Pleural effusion, not elsewhere classified: Secondary | ICD-10-CM | POA: Diagnosis not present

## 2020-05-05 DIAGNOSIS — R509 Fever, unspecified: Secondary | ICD-10-CM | POA: Diagnosis not present

## 2020-05-05 DIAGNOSIS — Z7951 Long term (current) use of inhaled steroids: Secondary | ICD-10-CM | POA: Diagnosis not present

## 2020-05-05 DIAGNOSIS — I517 Cardiomegaly: Secondary | ICD-10-CM | POA: Diagnosis not present

## 2020-05-05 DIAGNOSIS — Z886 Allergy status to analgesic agent status: Secondary | ICD-10-CM | POA: Diagnosis not present

## 2020-05-05 DIAGNOSIS — E785 Hyperlipidemia, unspecified: Secondary | ICD-10-CM | POA: Diagnosis not present

## 2020-05-05 DIAGNOSIS — I319 Disease of pericardium, unspecified: Secondary | ICD-10-CM | POA: Diagnosis not present

## 2020-05-05 DIAGNOSIS — J449 Chronic obstructive pulmonary disease, unspecified: Secondary | ICD-10-CM | POA: Diagnosis not present

## 2020-05-05 DIAGNOSIS — Z87891 Personal history of nicotine dependence: Secondary | ICD-10-CM | POA: Diagnosis not present

## 2020-05-05 DIAGNOSIS — J029 Acute pharyngitis, unspecified: Secondary | ICD-10-CM | POA: Diagnosis not present

## 2020-05-05 DIAGNOSIS — N289 Disorder of kidney and ureter, unspecified: Secondary | ICD-10-CM | POA: Diagnosis not present

## 2020-05-05 DIAGNOSIS — Z79899 Other long term (current) drug therapy: Secondary | ICD-10-CM | POA: Diagnosis not present

## 2020-05-05 DIAGNOSIS — I10 Essential (primary) hypertension: Secondary | ICD-10-CM | POA: Diagnosis not present

## 2020-05-05 DIAGNOSIS — Z86718 Personal history of other venous thrombosis and embolism: Secondary | ICD-10-CM | POA: Diagnosis not present

## 2020-05-05 DIAGNOSIS — R079 Chest pain, unspecified: Secondary | ICD-10-CM | POA: Diagnosis not present

## 2020-05-05 DIAGNOSIS — R600 Localized edema: Secondary | ICD-10-CM | POA: Diagnosis not present

## 2020-05-07 DIAGNOSIS — I319 Disease of pericardium, unspecified: Secondary | ICD-10-CM | POA: Diagnosis not present

## 2020-05-07 DIAGNOSIS — I272 Pulmonary hypertension, unspecified: Secondary | ICD-10-CM | POA: Diagnosis not present

## 2020-05-09 DIAGNOSIS — I10 Essential (primary) hypertension: Secondary | ICD-10-CM | POA: Diagnosis not present

## 2020-05-09 DIAGNOSIS — R0982 Postnasal drip: Secondary | ICD-10-CM | POA: Diagnosis not present

## 2020-05-09 DIAGNOSIS — J449 Chronic obstructive pulmonary disease, unspecified: Secondary | ICD-10-CM | POA: Diagnosis not present

## 2020-05-09 DIAGNOSIS — Z79899 Other long term (current) drug therapy: Secondary | ICD-10-CM | POA: Diagnosis not present

## 2020-05-09 DIAGNOSIS — J019 Acute sinusitis, unspecified: Secondary | ICD-10-CM | POA: Diagnosis not present

## 2020-05-09 DIAGNOSIS — H9201 Otalgia, right ear: Secondary | ICD-10-CM | POA: Diagnosis not present

## 2020-05-09 DIAGNOSIS — Z7984 Long term (current) use of oral hypoglycemic drugs: Secondary | ICD-10-CM | POA: Diagnosis not present

## 2020-05-09 DIAGNOSIS — Z86718 Personal history of other venous thrombosis and embolism: Secondary | ICD-10-CM | POA: Diagnosis not present

## 2020-05-09 DIAGNOSIS — Z9981 Dependence on supplemental oxygen: Secondary | ICD-10-CM | POA: Diagnosis not present

## 2020-05-09 DIAGNOSIS — Z87891 Personal history of nicotine dependence: Secondary | ICD-10-CM | POA: Diagnosis not present

## 2020-05-09 DIAGNOSIS — J029 Acute pharyngitis, unspecified: Secondary | ICD-10-CM | POA: Diagnosis not present

## 2020-05-09 DIAGNOSIS — E785 Hyperlipidemia, unspecified: Secondary | ICD-10-CM | POA: Diagnosis not present

## 2020-05-09 DIAGNOSIS — E119 Type 2 diabetes mellitus without complications: Secondary | ICD-10-CM | POA: Diagnosis not present

## 2020-05-09 DIAGNOSIS — Z886 Allergy status to analgesic agent status: Secondary | ICD-10-CM | POA: Diagnosis not present

## 2020-05-10 ENCOUNTER — Inpatient Hospital Stay
Admission: EM | Admit: 2020-05-10 | Discharge: 2020-05-14 | DRG: 291 | Disposition: A | Discharge status: Home / Self Care | Payer: Medicare Other | Attending: Internal Medicine | Admitting: Internal Medicine

## 2020-05-10 ENCOUNTER — Emergency Department: Payer: Medicare Other

## 2020-05-10 ENCOUNTER — Encounter: Payer: Self-pay | Admitting: Emergency Medicine

## 2020-05-10 ENCOUNTER — Other Ambulatory Visit: Payer: Self-pay

## 2020-05-10 DIAGNOSIS — I5033 Acute on chronic diastolic (congestive) heart failure: Secondary | ICD-10-CM | POA: Diagnosis present

## 2020-05-10 DIAGNOSIS — R7989 Other specified abnormal findings of blood chemistry: Secondary | ICD-10-CM | POA: Diagnosis present

## 2020-05-10 DIAGNOSIS — Z96652 Presence of left artificial knee joint: Secondary | ICD-10-CM | POA: Diagnosis present

## 2020-05-10 DIAGNOSIS — E1122 Type 2 diabetes mellitus with diabetic chronic kidney disease: Secondary | ICD-10-CM | POA: Diagnosis not present

## 2020-05-10 DIAGNOSIS — J440 Chronic obstructive pulmonary disease with acute lower respiratory infection: Secondary | ICD-10-CM | POA: Diagnosis present

## 2020-05-10 DIAGNOSIS — J9602 Acute respiratory failure with hypercapnia: Secondary | ICD-10-CM | POA: Diagnosis not present

## 2020-05-10 DIAGNOSIS — Z86711 Personal history of pulmonary embolism: Secondary | ICD-10-CM

## 2020-05-10 DIAGNOSIS — J40 Bronchitis, not specified as acute or chronic: Secondary | ICD-10-CM | POA: Diagnosis present

## 2020-05-10 DIAGNOSIS — J9811 Atelectasis: Secondary | ICD-10-CM | POA: Diagnosis present

## 2020-05-10 DIAGNOSIS — Z743 Need for continuous supervision: Secondary | ICD-10-CM | POA: Diagnosis not present

## 2020-05-10 DIAGNOSIS — E1165 Type 2 diabetes mellitus with hyperglycemia: Secondary | ICD-10-CM | POA: Diagnosis present

## 2020-05-10 DIAGNOSIS — Z20822 Contact with and (suspected) exposure to covid-19: Secondary | ICD-10-CM | POA: Diagnosis present

## 2020-05-10 DIAGNOSIS — N179 Acute kidney failure, unspecified: Secondary | ICD-10-CM | POA: Diagnosis present

## 2020-05-10 DIAGNOSIS — E785 Hyperlipidemia, unspecified: Secondary | ICD-10-CM | POA: Diagnosis not present

## 2020-05-10 DIAGNOSIS — J811 Chronic pulmonary edema: Secondary | ICD-10-CM | POA: Diagnosis not present

## 2020-05-10 DIAGNOSIS — I319 Disease of pericardium, unspecified: Secondary | ICD-10-CM | POA: Diagnosis not present

## 2020-05-10 DIAGNOSIS — I471 Supraventricular tachycardia: Secondary | ICD-10-CM | POA: Diagnosis not present

## 2020-05-10 DIAGNOSIS — I499 Cardiac arrhythmia, unspecified: Secondary | ICD-10-CM | POA: Diagnosis not present

## 2020-05-10 DIAGNOSIS — N184 Chronic kidney disease, stage 4 (severe): Secondary | ICD-10-CM | POA: Diagnosis present

## 2020-05-10 DIAGNOSIS — D649 Anemia, unspecified: Secondary | ICD-10-CM | POA: Diagnosis not present

## 2020-05-10 DIAGNOSIS — Z6841 Body Mass Index (BMI) 40.0 and over, adult: Secondary | ICD-10-CM

## 2020-05-10 DIAGNOSIS — J9621 Acute and chronic respiratory failure with hypoxia: Secondary | ICD-10-CM | POA: Diagnosis present

## 2020-05-10 DIAGNOSIS — I5031 Acute diastolic (congestive) heart failure: Secondary | ICD-10-CM

## 2020-05-10 DIAGNOSIS — Z833 Family history of diabetes mellitus: Secondary | ICD-10-CM

## 2020-05-10 DIAGNOSIS — R0603 Acute respiratory distress: Secondary | ICD-10-CM

## 2020-05-10 DIAGNOSIS — Z9981 Dependence on supplemental oxygen: Secondary | ICD-10-CM

## 2020-05-10 DIAGNOSIS — Z7951 Long term (current) use of inhaled steroids: Secondary | ICD-10-CM

## 2020-05-10 DIAGNOSIS — J90 Pleural effusion, not elsewhere classified: Secondary | ICD-10-CM | POA: Diagnosis not present

## 2020-05-10 DIAGNOSIS — J441 Chronic obstructive pulmonary disease with (acute) exacerbation: Secondary | ICD-10-CM | POA: Diagnosis not present

## 2020-05-10 DIAGNOSIS — I509 Heart failure, unspecified: Secondary | ICD-10-CM

## 2020-05-10 DIAGNOSIS — M109 Gout, unspecified: Secondary | ICD-10-CM | POA: Diagnosis present

## 2020-05-10 DIAGNOSIS — J209 Acute bronchitis, unspecified: Secondary | ICD-10-CM | POA: Diagnosis not present

## 2020-05-10 DIAGNOSIS — I10 Essential (primary) hypertension: Secondary | ICD-10-CM | POA: Diagnosis not present

## 2020-05-10 DIAGNOSIS — I440 Atrioventricular block, first degree: Secondary | ICD-10-CM | POA: Diagnosis not present

## 2020-05-10 DIAGNOSIS — Z79899 Other long term (current) drug therapy: Secondary | ICD-10-CM

## 2020-05-10 DIAGNOSIS — I517 Cardiomegaly: Secondary | ICD-10-CM | POA: Diagnosis not present

## 2020-05-10 DIAGNOSIS — J9601 Acute respiratory failure with hypoxia: Principal | ICD-10-CM

## 2020-05-10 DIAGNOSIS — I130 Hypertensive heart and chronic kidney disease with heart failure and stage 1 through stage 4 chronic kidney disease, or unspecified chronic kidney disease: Principal | ICD-10-CM | POA: Diagnosis present

## 2020-05-10 DIAGNOSIS — I160 Hypertensive urgency: Secondary | ICD-10-CM | POA: Diagnosis not present

## 2020-05-10 DIAGNOSIS — R0602 Shortness of breath: Secondary | ICD-10-CM | POA: Diagnosis not present

## 2020-05-10 DIAGNOSIS — R0689 Other abnormalities of breathing: Secondary | ICD-10-CM | POA: Diagnosis not present

## 2020-05-10 DIAGNOSIS — I161 Hypertensive emergency: Secondary | ICD-10-CM | POA: Diagnosis present

## 2020-05-10 DIAGNOSIS — Z87891 Personal history of nicotine dependence: Secondary | ICD-10-CM

## 2020-05-10 DIAGNOSIS — R079 Chest pain, unspecified: Secondary | ICD-10-CM | POA: Diagnosis not present

## 2020-05-10 DIAGNOSIS — J9622 Acute and chronic respiratory failure with hypercapnia: Secondary | ICD-10-CM | POA: Diagnosis not present

## 2020-05-10 LAB — COMPREHENSIVE METABOLIC PANEL
ALT: 28 U/L (ref 0–44)
AST: 30 U/L (ref 15–41)
Albumin: 3.5 g/dL (ref 3.5–5.0)
Alkaline Phosphatase: 122 U/L (ref 38–126)
Anion gap: 8 (ref 5–15)
BUN: 36 mg/dL — ABNORMAL HIGH (ref 8–23)
CO2: 38 mmol/L — ABNORMAL HIGH (ref 22–32)
Calcium: 9.2 mg/dL (ref 8.9–10.3)
Chloride: 94 mmol/L — ABNORMAL LOW (ref 98–111)
Creatinine, Ser: 1.83 mg/dL — ABNORMAL HIGH (ref 0.44–1.00)
GFR, Estimated: 27 mL/min — ABNORMAL LOW (ref >60)
Glucose, Bld: 251 mg/dL — ABNORMAL HIGH (ref 70–99)
Potassium: 4.2 mmol/L (ref 3.5–5.1)
Sodium: 140 mmol/L (ref 135–145)
Total Bilirubin: 0.7 mg/dL (ref 0.3–1.2)
Total Protein: 8.2 g/dL — ABNORMAL HIGH (ref 6.5–8.1)

## 2020-05-10 LAB — BLOOD GAS, ARTERIAL
Acid-Base Excess: 12.1 mmol/L — ABNORMAL HIGH (ref 0.0–2.0)
Bicarbonate: 42.6 mmol/L — ABNORMAL HIGH (ref 20.0–28.0)
Delivery systems: POSITIVE
Expiratory PAP: 8
FIO2: 0.6
Inspiratory PAP: 16
O2 Saturation: 91 %
Patient temperature: 37
pCO2 arterial: 95 mmHg (ref 32.0–48.0)
pH, Arterial: 7.26 — ABNORMAL LOW (ref 7.350–7.450)
pO2, Arterial: 70 mmHg — ABNORMAL LOW (ref 83.0–108.0)

## 2020-05-10 LAB — RESPIRATORY PANEL BY RT PCR (FLU A&B, COVID)
Influenza A by PCR: NEGATIVE
Influenza B by PCR: NEGATIVE
SARS Coronavirus 2 by RT PCR: NEGATIVE

## 2020-05-10 LAB — LACTIC ACID, PLASMA: Lactic Acid, Venous: 0.8 mmol/L (ref 0.5–1.9)

## 2020-05-10 LAB — BRAIN NATRIURETIC PEPTIDE: B Natriuretic Peptide: 180.7 pg/mL — ABNORMAL HIGH (ref 0.0–100.0)

## 2020-05-10 MED ORDER — SODIUM CHLORIDE 0.9 % IV SOLN
2.0000 g | INTRAVENOUS | Status: DC
  Administered 2020-05-11 – 2020-05-14 (×4): 2 g via INTRAVENOUS
  Filled 2020-05-10 (×2): qty 2
  Filled 2020-05-10 (×2): qty 20
  Filled 2020-05-10: qty 2

## 2020-05-10 MED ORDER — FUROSEMIDE 10 MG/ML IJ SOLN
INTRAMUSCULAR | Status: AC
  Administered 2020-05-10: 80 mg via INTRAVENOUS
  Filled 2020-05-10: qty 8

## 2020-05-10 MED ORDER — ACETAMINOPHEN 325 MG PO TABS
650.0000 mg | ORAL_TABLET | Freq: Four times a day (QID) | ORAL | Status: DC | PRN
  Administered 2020-05-12: 650 mg via ORAL
  Filled 2020-05-10: qty 2

## 2020-05-10 MED ORDER — ACETAMINOPHEN 650 MG RE SUPP: 650.0000 mg | Freq: Four times a day (QID) | RECTAL | Status: DC | PRN

## 2020-05-10 MED ORDER — IPRATROPIUM-ALBUTEROL 0.5-2.5 (3) MG/3ML IN SOLN
RESPIRATORY_TRACT | Status: AC
  Administered 2020-05-10: 3 mL
  Filled 2020-05-10: qty 9

## 2020-05-10 MED ORDER — IPRATROPIUM-ALBUTEROL 0.5-2.5 (3) MG/3ML IN SOLN
3.0000 mL | Freq: Four times a day (QID) | RESPIRATORY_TRACT | Status: DC
  Administered 2020-05-11 – 2020-05-14 (×12): 3 mL via RESPIRATORY_TRACT
  Filled 2020-05-10 (×4): qty 3
  Filled 2020-05-10: qty 6
  Filled 2020-05-10 (×5): qty 3

## 2020-05-10 MED ORDER — PREDNISONE 20 MG PO TABS
40.0000 mg | ORAL_TABLET | Freq: Every day | ORAL | Status: DC
  Administered 2020-05-12 – 2020-05-14 (×3): 40 mg via ORAL
  Filled 2020-05-10 (×3): qty 2

## 2020-05-10 MED ORDER — HYDROCOD POLST-CPM POLST ER 10-8 MG/5ML PO SUER: 5.0000 mL | Freq: Two times a day (BID) | ORAL | Status: DC | PRN

## 2020-05-10 MED ORDER — ATORVASTATIN CALCIUM 10 MG PO TABS
5.0000 mg | ORAL_TABLET | Freq: Every day | ORAL | Status: DC
  Administered 2020-05-11 – 2020-05-14 (×4): 5 mg via ORAL
  Filled 2020-05-10 (×4): qty 1
  Filled 2020-05-10: qty 0.5

## 2020-05-10 MED ORDER — ALBUTEROL SULFATE (2.5 MG/3ML) 0.083% IN NEBU
INHALATION_SOLUTION | RESPIRATORY_TRACT | Status: AC
  Administered 2020-05-10: 2.5 mg
  Filled 2020-05-10: qty 6

## 2020-05-10 MED ORDER — ENOXAPARIN SODIUM 40 MG/0.4ML ~~LOC~~ SOLN
40.0000 mg | SUBCUTANEOUS | Status: DC
  Administered 2020-05-11: 40 mg via SUBCUTANEOUS
  Filled 2020-05-10: qty 0.4

## 2020-05-10 MED ORDER — ONDANSETRON HCL 4 MG PO TABS: 4.0000 mg | ORAL_TABLET | Freq: Four times a day (QID) | ORAL | Status: DC | PRN

## 2020-05-10 MED ORDER — COLCHICINE 0.6 MG PO TABS
0.6000 mg | ORAL_TABLET | ORAL | Status: DC
  Administered 2020-05-11 – 2020-05-13 (×2): 0.6 mg via ORAL
  Filled 2020-05-10 (×3): qty 1

## 2020-05-10 MED ORDER — FUROSEMIDE 10 MG/ML IJ SOLN: 80.0000 mg | Freq: Once | INTRAMUSCULAR | Status: AC

## 2020-05-10 MED ORDER — GUAIFENESIN ER 600 MG PO TB12
600.0000 mg | ORAL_TABLET | Freq: Two times a day (BID) | ORAL | Status: DC
  Administered 2020-05-11 – 2020-05-14 (×7): 600 mg via ORAL
  Filled 2020-05-10 (×7): qty 1

## 2020-05-10 MED ORDER — MAGNESIUM HYDROXIDE 400 MG/5ML PO SUSP: 30.0000 mL | Freq: Every day | ORAL | Status: DC | PRN

## 2020-05-10 MED ORDER — METHYLPREDNISOLONE SODIUM SUCC 40 MG IJ SOLR
40.0000 mg | Freq: Three times a day (TID) | INTRAMUSCULAR | Status: AC
  Administered 2020-05-11 (×2): 40 mg via INTRAVENOUS
  Filled 2020-05-10 (×2): qty 1

## 2020-05-10 MED ORDER — NITROGLYCERIN IN D5W 200-5 MCG/ML-% IV SOLN
INTRAVENOUS | Status: AC
  Filled 2020-05-10: qty 250

## 2020-05-10 MED ORDER — METHOCARBAMOL 500 MG PO TABS
500.0000 mg | ORAL_TABLET | ORAL | Status: DC | PRN
  Filled 2020-05-10 (×2): qty 1

## 2020-05-10 MED ORDER — ONDANSETRON HCL 4 MG/2ML IJ SOLN: 4.0000 mg | Freq: Four times a day (QID) | INTRAMUSCULAR | Status: DC | PRN

## 2020-05-10 MED ORDER — VITAMIN D (ERGOCALCIFEROL) 1.25 MG (50000 UNIT) PO CAPS
50000.0000 [IU] | ORAL_CAPSULE | ORAL | Status: DC
  Administered 2020-05-12: 50000 [IU] via ORAL
  Filled 2020-05-10: qty 1

## 2020-05-10 MED ORDER — ALBUTEROL SULFATE (2.5 MG/3ML) 0.083% IN NEBU: 2.5000 mg | INHALATION_SOLUTION | Freq: Four times a day (QID) | RESPIRATORY_TRACT | Status: DC | PRN

## 2020-05-10 MED ORDER — NITROGLYCERIN IN D5W 200-5 MCG/ML-% IV SOLN
50.0000 ug/min | INTRAVENOUS | Status: DC
  Administered 2020-05-10: 50 ug/min via INTRAVENOUS
  Filled 2020-05-10: qty 250

## 2020-05-10 MED ORDER — TRAZODONE HCL 50 MG PO TABS
25.0000 mg | ORAL_TABLET | Freq: Every evening | ORAL | Status: DC | PRN
  Administered 2020-05-12: 25 mg via ORAL
  Filled 2020-05-10: qty 1

## 2020-05-10 MED ORDER — FUROSEMIDE 10 MG/ML IJ SOLN
60.0000 mg | Freq: Two times a day (BID) | INTRAMUSCULAR | Status: DC
  Administered 2020-05-11 (×2): 60 mg via INTRAVENOUS
  Filled 2020-05-10: qty 8
  Filled 2020-05-10: qty 6

## 2020-05-10 MED ORDER — IPRATROPIUM BROMIDE 0.02 % IN SOLN: 0.5000 mg | Freq: Once | RESPIRATORY_TRACT | Status: DC

## 2020-05-10 MED ORDER — ALBUTEROL (5 MG/ML) CONTINUOUS INHALATION SOLN
10.0000 mg/h | INHALATION_SOLUTION | Freq: Once | RESPIRATORY_TRACT | Status: DC
  Filled 2020-05-10: qty 20

## 2020-05-10 NOTE — ED Provider Notes (Signed)
[facility] Emergency Department Provider Note ____________________________________________   First MD Initiated Contact with Patient 05/10/20 2154     (approximate)  I have reviewed the triage vital signs and the nursing notes.  HISTORY  Chief Complaint Shortness of Breath (Recently seen at [facility], prior diagnosis of pericarditis. )   HPI Pamela Cuevas is a 84 y.o. femalewho presents to the ED for evaluation of shortness of breath and respiratory distress.  Chart review indicates history of obesity, HTN, HLD, COPD on home O2, CHF.  Remote history of provoked DVT in the setting of knee surgery.  Patient is fully vaccinated for COVID-19. Reportedly diagnosed with pericarditis earlier this week and prescribed colchicine. ED visit yesterday for upper respiratory congestion, diagnosed with rhinosinusitis and advised to take Flonase spray.  Patient prescribed Augmentin.    Patient presents to the ED in extremis and respiratory distress indicating "few days" of shortness of breath.  Denies chest pain.  Denies fevers.  Unable to provide additional history due to acuity of condition.    Past Medical History:  Diagnosis Date  . Diabetes mellitus, type 2 ([place])   . Hypertension   . PE (pulmonary thromboembolism) ([place]) 2010   After knee replacement    Patient Active Problem List   Diagnosis Date Noted  . Acute CHF (congestive heart failure) ([place]) 05/10/2020    Past Surgical History:  Procedure Laterality Date  . CATARACT EXTRACTION W/PHACO Left 03/26/2020   Procedure: CATARACT EXTRACTION PHACO AND INTRAOCULAR LENS PLACEMENT ([place]) LEFT DIABETIC;  Surgeon: Birder Robson, MD;  Location: [place];  Service: Ophthalmology;  Laterality: Left;  7.89 0:44.3  . CATARACT EXTRACTION W/PHACO Right 04/21/2020   Procedure: CATARACT EXTRACTION PHACO AND INTRAOCULAR LENS PLACEMENT ([facility]) RIGHT DIABETIC 7.50 01:12.5;  Surgeon: Birder Robson, MD;   Location: [place];  Service: Ophthalmology;  Laterality: Right;  Diabetic - oral meds  . REPLACEMENT TOTAL KNEE Left 2010    Prior to Admission medications   Medication Sig Start Date End Date Taking? Authorizing Provider  albuterol (VENTOLIN HFA) 108 (90 Base) MCG/ACT inhaler Inhale 1-2 puffs into the lungs every 6 (six) hours as needed for wheezing or shortness of breath.    [provider]  amoxicillin-clavulanate (AUGMENTIN) 875-125 MG tablet Take 1 tablet by mouth 2 (two) times daily. 05/09/20   [provider]  atorvastatin (LIPITOR) 10 MG tablet Take 5 mg by mouth daily.     [provider]  colchicine 0.6 MG tablet Take 0.6 mg by mouth every other day. 05/07/20   [provider]  ergocalciferol (VITAMIN D2) 1.25 MG (50000 UT) capsule Take 50,000 Units by mouth every Tuesday.    [provider]  Fluticasone-Salmeterol (ADVAIR DISKUS IN) Inhale into the lungs as needed.    [provider]  furosemide (LASIX) 40 MG tablet Take 40 mg by mouth.    [provider]  lisinopril (ZESTRIL) 5 MG tablet Take 5 mg by mouth daily. Patient not taking: Reported on 03/24/2020    [provider]  methocarbamol (ROBAXIN) 500 MG tablet Take 500 mg by mouth as needed for muscle spasms.    [provider]  pioglitazone (ACTOS) 30 MG tablet Take 30 mg by mouth daily.    [provider]    Allergies Aspirin  History reviewed. No pertinent family history.  Social History Social History   Tobacco Use  . Smoking status: Former Smoker    Types: Cigarettes    Quit  date: 2003    Years since quitting: 18.8  . Smokeless tobacco: Never Used  Vaping Use  . Vaping Use: Never used  Substance Use Topics  . Alcohol use: Not Currently  . Drug use: Not on file    Review of Systems  Unable to be accurately assessed due to acuity of condition and patient's respiratory  distress ________________________________   PHYSICAL EXAM:  VITAL SIGNS: Vitals:   05/10/20 2315 05/10/20 2330  BP: (!) 141/70 127/60  Pulse: (!) 113 (!) 114  Resp: (!) 33 (!) 29  Temp:    SpO2: 94% 92%      Constitutional: Alert and oriented.  Sitting up in bed tripoding in obvious respiratory distress on EMS NRB.  Obese. Eyes: Conjunctivae are normal. PERRL. EOMI. Head: Atraumatic. Nose: No congestion/rhinnorhea. Mouth/Throat: Mucous membranes are dry.  Oropharynx non-erythematous. Neck: No stridor. No cervical spine tenderness to palpation. Cardiovascular: Tachycardic rate, regular rhythm. Grossly normal heart sounds.  Good peripheral circulation. Respiratory: Tachypneic and dyspneic on NRB.  Auscultation reveals poor air movement throughout with faint expiratory wheezes and bibasilar crackles. Gastrointestinal: Soft , nondistended, nontender to palpation. No abdominal bruits. No CVA tenderness. Musculoskeletal:  No joint effusions. No signs of acute trauma. Pitting edema to bilateral lower extremities. Neurologic: No gross focal neurologic deficits are appreciated. No gait instability noted. Skin:  Skin is warm, dry and intact. No rash noted. Psychiatric: Mood and affect are normal. Speech and behavior are normal.  ____________________________________________   LABS (all labs ordered are listed, but only abnormal results are displayed)  Labs Reviewed  BRAIN NATRIURETIC PEPTIDE - Abnormal; Notable for the following components:      Result Value   B Natriuretic Peptide 180.7 (*)    All other components within normal limits  COMPREHENSIVE METABOLIC PANEL - Abnormal; Notable for the following components:   Chloride 94 (*)    CO2 38 (*)    Glucose, Bld 251 (*)    BUN 36 (*)    Creatinine, Ser 1.83 (*)    Total Protein 8.2 (*)    GFR, Estimated 27 (*)    All other components within normal limits  BLOOD GAS, ARTERIAL - Abnormal; Notable for the following components:    pH, Arterial 7.26 (*)    pCO2 arterial 95 (*)    pO2, Arterial 70 (*)    Bicarbonate 42.6 (*)    Acid-Base Excess 12.1 (*)    All other components within normal limits  RESPIRATORY PANEL BY RT PCR (FLU A&B, COVID)  LACTIC ACID, PLASMA  LACTIC ACID, PLASMA  MAGNESIUM  BLOOD GAS, ARTERIAL  TROPONIN I (HIGH SENSITIVITY)  TROPONIN I (HIGH SENSITIVITY)   ____________________________________________  12 Lead EKG Sinus rhythm, rate of 102 bpm.  Normal axis intervals.  No evidence of acute ischemia.  Sinus tachycardia.  ____________________________________________  RADIOLOGY  ED MD interpretation: CXR reviewed by me with cardiomegaly and pulmonary vascular congestion with small bilateral pleural effusions.  Official radiology report(s): DG Chest Portable 1 View  Result Date: 05/10/2020 CLINICAL DATA:  Respiratory distress EXAM: PORTABLE CHEST 1 VIEW COMPARISON:  05/05/2020 FINDINGS: Moderate cardiomegaly with small pleural effusions and mild pulmonary edema. IMPRESSION: Cardiomegaly with small pleural effusions and mild pulmonary edema. Electronically Signed   By: Ulyses Jarred M.D.   On: 05/10/2020 23:01    ____________________________________________   PROCEDURES and INTERVENTIONS  Procedure(s) performed (including Critical Care):  .1-3 Lead EKG Interpretation Performed by: Vladimir Crofts, MD Authorized by: Vladimir Crofts, MD  Interpretation: abnormal     ECG rate:  110   ECG rate assessment: tachycardic     Rhythm: sinus tachycardia     Ectopy: none     Conduction: normal   .Critical Care Performed by: Vladimir Crofts, MD Authorized by: Vladimir Crofts, MD   Critical care provider statement:    Critical care time (minutes):  45   Critical care was necessary to treat or prevent imminent or life-threatening deterioration of the following conditions:  Circulatory failure, respiratory failure and cardiac failure   Critical care was time spent personally by me on the following  activities:  Discussions with consultants, evaluation of patient's response to treatment, examination of patient, ordering and performing treatments and interventions, ordering and review of laboratory studies, ordering and review of radiographic studies, pulse oximetry, re-evaluation of patient's condition, obtaining history from patient or surrogate and review of old charts    Medications  nitroGLYCERIN 50 mg in dextrose 5 % 250 mL (0.2 mg/mL) infusion (50 mcg/min Intravenous Rate/Dose Verify 05/10/20 2242)  albuterol (PROVENTIL,VENTOLIN) solution continuous neb (has no administration in time range)  ipratropium (ATROVENT) nebulizer solution 0.5 mg (has no administration in time range)  furosemide (LASIX) injection 80 mg (80 mg Intravenous Given 05/10/20 2148)  albuterol (PROVENTIL) (2.5 MG/3ML) 0.083% nebulizer solution (2.5 mg  Given 05/10/20 2150)  ipratropium-albuterol (DUONEB) 0.5-2.5 (3) MG/3ML nebulizer solution (3 mLs  Given 05/10/20 2151)    ____________________________________________   MDM / ED COURSE  84 year old female with history of COPD and CHF presents to the ED and extremis due to mixed exacerbations of COPD and CHF requiring BiPAP, nitro drip and medical admission.  Patient presented hypertensive, tachycardic and hypoxic on her home O2 in obvious respiratory distress tripoding on a nonrebreather.  Patient quickly placed on BiPAP with a nitro drip and subsequently improving clinical status.  Inline albuterol neb and Atrovent to treat COPD exacerbation, nitro drip and IV Lasix for CHF exacerbation and volume overload.  Covid swab is negative and patient is fully vaccinated.  CXR with evidence of volume overload suggestive of CHF without evidence of discrete lobar filtration to suggest a bacterial source of her symptoms.  ABG with an acute respiratory acidosis likely due to her COPD exacerbation.  Patient has settled out on BiPAP and looks improved clinically and with improved  volumes.  No indications for emergent intubation at this time.  Will admit to hospitalist medicine for further work-up and management.  Clinical Course as of May 11 2351  Nancy Fetter May 10, 2020  2150 Patient presents in respiratory distress.  Clinically mixed CHF and COPD exacerbation.  EMS reports patient was recently diagnosed with pericarditis.  I immediately perform bedside cardiac ultrasound to assess for pericardial effusion and tamponade physiology, which I do not appreciate.  She has copious B-lines on pulmonary exam indicative of fluid overload and CHF exacerbation.   [DS]  2155 Patient quickly placed on BiPAP.  Her mental status is not reassuring and she is only pulling volumes of 200-300 cc.  Albuterol and nitroglycerin drip ongoing.  IV Lasix provided.   [DS]  2254 Reassessed.  Patient looks improved on BiPAP.  Sharper mentation.  Improved tidal volumes   [DS]    Clinical Course User Index [DS] Vladimir Crofts, MD     ____________________________________________   FINAL CLINICAL IMPRESSION(S) / ED DIAGNOSES  Final diagnoses:  Respiratory distress  Acute respiratory failure with hypoxia and hypercapnia [facility])     ED Discharge Orders    None  Vladimir Crofts   Note:  This document was prepared using Systems analyst and may include unintentional dictation errors.   Vladimir Crofts, MD 05/10/20 [phone]

## 2020-05-10 NOTE — ED Notes (Signed)
  Pamela Cuevas ( husband )  Home Number [phone]

## 2020-05-10 NOTE — H&P (Addendum)
[place]   PATIENT NAME: Pamela Cuevas    MR#:  [id number]  DATE OF BIRTH:  1936/06/27  DATE OF ADMISSION:  05/10/2020  PRIMARY CARE PHYSICIAN: Hamrick, Lorin Mercy, MD   REQUESTING/REFERRING PHYSICIAN: Vladimir Crofts, MD  CHIEF COMPLAINT:   Chief Complaint  Patient presents with  . Shortness of Breath    Recently seen at [facility], prior diagnosis of pericarditis.     HISTORY OF PRESENT ILLNESS:  Pamela Cuevas  is a 84 y.o. Caucasian female with a known history of type 2 diabetes mellitus, hypertension, osteoarthritis and recent pericarditis for which she was hospitalized at [facility] few days ago, who presented to the emergency room with acute onset of worsening dyspnea with associated cough but has been mainly dry as well as wheezing over the last couple of days no significant deterioration today.  She denied any fever or chills.  She denied any more chest pain.  No nausea or vomiting or abdominal pain.  No dysuria, oliguria or hematuria or flank pain.  Upon presentation to the emergency room, blood pressure was 176/85 with a heart rate of 106 and respiratory rate of 33 with a pulse oximetry that was 91% on BiPAP and later 94 to 96%.  Labs revealed ABG with pH 7.26, PCO2 95 PO2 of 17 O2 sat of 42.6% on room air.  CMP was remarkable for CO2 38 and blood glucose 251, BUN of 36 and creatinine 1.83 with no previous available labs here since 2010.  BNP was 180.7 and lactic acid 0.8.  Influenza antigens and COVID-19 PCR came back negative.  EKG showed ectopic atrial tachycardia with rate 102 with low voltage QRS and poor R wave progression.  Chest x-ray showed cardiomegaly with small pleural effusions and mild pulmonary edema.  The patient was given 80 mg of IV Lasix and IV nitroglycerin drip with systolic BP has been as high as 200.  She will be admitted to a progressive cardiac unit bed for further evaluation and management. PAST MEDICAL HISTORY:   Past Medical History:    Diagnosis Date  . Diabetes mellitus, type 2 ([place])   . Hypertension   . PE (pulmonary thromboembolism) ([place]) 2010   After knee replacement    PAST SURGICAL HISTORY:   Past Surgical History:  Procedure Laterality Date  . CATARACT EXTRACTION W/PHACO Left 03/26/2020   Procedure: CATARACT EXTRACTION PHACO AND INTRAOCULAR LENS PLACEMENT ([place]) LEFT DIABETIC;  Surgeon: Birder Robson, MD;  Location: [place];  Service: Ophthalmology;  Laterality: Left;  7.89 0:44.3  . CATARACT EXTRACTION W/PHACO Right 04/21/2020   Procedure: CATARACT EXTRACTION PHACO AND INTRAOCULAR LENS PLACEMENT ([facility]) RIGHT DIABETIC 7.50 01:12.5;  Surgeon: Birder Robson, MD;  Location: [place];  Service: Ophthalmology;  Laterality: Right;  Diabetic - oral meds  . REPLACEMENT TOTAL KNEE Left 2010    SOCIAL HISTORY:   Social History   Tobacco Use  . Smoking status: Former Smoker    Types: Cigarettes    Quit date: 2003    Years since quitting: 18.8  . Smokeless tobacco: Never Used  Substance Use Topics  . Alcohol use: Not Currently    FAMILY HISTORY:   Positive diabetes mellitus  DRUG ALLERGIES:   Allergies  Allergen Reactions  . Aspirin Nausea Only    REVIEW OF SYSTEMS:   ROS As per history of present illness. All pertinent systems were reviewed above. Constitutional, HEENT, cardiovascular, respiratory, GI, GU, musculoskeletal, neuro, psychiatric, endocrine, integumentary and hematologic  systems were reviewed and are otherwise negative/unremarkable except for positive findings mentioned above in the HPI.   MEDICATIONS AT HOME:   Prior to Admission medications   Medication Sig Start Date End Date Taking? Authorizing Provider  albuterol (VENTOLIN HFA) 108 (90 Base) MCG/ACT inhaler Inhale 1-2 puffs into the lungs every 6 (six) hours as needed for wheezing or shortness of breath.    [provider]  amoxicillin-clavulanate (AUGMENTIN) 875-125 MG tablet Take 1 tablet by  mouth 2 (two) times daily. 05/09/20   [provider]  atorvastatin (LIPITOR) 10 MG tablet Take 5 mg by mouth daily.     [provider]  colchicine 0.6 MG tablet Take 0.6 mg by mouth every other day. 05/07/20   [provider]  ergocalciferol (VITAMIN D2) 1.25 MG (50000 UT) capsule Take 50,000 Units by mouth every Tuesday.    [provider]  Fluticasone-Salmeterol (ADVAIR DISKUS IN) Inhale into the lungs as needed.    [provider]  furosemide (LASIX) 40 MG tablet Take 40 mg by mouth.    [provider]  lisinopril (ZESTRIL) 5 MG tablet Take 5 mg by mouth daily. Patient not taking: Reported on 03/24/2020    [provider]  methocarbamol (ROBAXIN) 500 MG tablet Take 500 mg by mouth as needed for muscle spasms.    [provider]  pioglitazone (ACTOS) 30 MG tablet Take 30 mg by mouth daily.    [provider]      VITAL SIGNS:  Blood pressure 127/60, pulse (!) 114, temperature 98.7 F (37.1 C), temperature source Axillary, resp. rate (!) 29, height 5\' 3"  (1.6 m), weight 104.3 kg, SpO2 92 %.  PHYSICAL EXAMINATION:  Physical Exam  GENERAL:  84 y.o.-year-old acutely ill, elderly Caucasian patient lying in the bed with mild to moderate respiratory distress on BiPAP.   EYES: Pupils equal, round, reactive to light and accommodation. No scleral icterus. Extraocular muscles intact.  HEENT: Head atraumatic, normocephalic. Oropharynx and nasopharynx clear.  NECK:  Supple, no jugular venous distention. No thyroid enlargement, no tenderness.  LUNGS: Diminished bibasal breath sounds with bibasal rales heart physical breathing with diminished expiratory airflow and occasional expiratory wheezes. CARDIOVASCULAR: Regular rate and rhythm, S1, S2 normal. No murmurs, rubs, or gallops.  ABDOMEN: Soft, nondistended, nontender. Bowel sounds present. No organomegaly or mass.  EXTREMITIES: 1+ bilateral lower extremity pitting edema  with no clubbing or cyanosis. NEUROLOGIC: Cranial nerves II through XII are intact. Muscle strength 5/5 in all extremities. Sensation intact. Gait not checked.  PSYCHIATRIC: The patient is alert and oriented x 3.  Normal affect and good eye contact. SKIN: No obvious rash, lesion, or ulcer.   LABORATORY PANEL:   CBC No results for input(s): WBC, HGB, HCT, PLT in the last 168 hours. ------------------------------------------------------------------------------------------------------------------  Chemistries  Recent Labs  Lab 05/10/20 2141  NA 140  K 4.2  CL 94*  CO2 38*  GLUCOSE 251*  BUN 36*  CREATININE 1.83*  CALCIUM 9.2  AST 30  ALT 28  ALKPHOS 122  BILITOT 0.7   ------------------------------------------------------------------------------------------------------------------  Cardiac Enzymes No results for input(s): TROPONINI in the last 168 hours. ------------------------------------------------------------------------------------------------------------------  RADIOLOGY:  DG Chest Portable 1 View  Result Date: 05/10/2020 CLINICAL DATA:  Respiratory distress EXAM: PORTABLE CHEST 1 VIEW COMPARISON:  05/05/2020 FINDINGS: Moderate cardiomegaly with small pleural effusions and mild pulmonary edema. IMPRESSION: Cardiomegaly with small pleural effusions and mild pulmonary edema. Electronically Signed   By: Ulyses Jarred M.D.   On: 05/10/2020 23:01  IMPRESSION AND PLAN:  1.  Acute hypoxic and hypercarbic respiratory failure secondary to COPD exacerbation as well as acute CHF likely diastolic. -The patient will be admitted to a progressive unit bed. -We will continue on BiPAP and follow ABGs. -We will continue diuresis with IV Lasix. -We will continue steroid therapy with IV Solu-Medrol and DuoNebs 4 times daily and every 4 hours as needed. -We will place her on IV antibiotic therapy for severe COPD exacerbation, with IV Rocephin and Zithromax. -Mucolytic therapy  will be provided. -We will hold off Advair Diskus. -2D echo will be obtained. -Cardiology consult will be obtained in a.m. -I notified Dr. Nehemiah Massed about the patient.  2.  Hypertensive urgency. -Will taper off IV nitroglycerin drip and place him on as needed IV labetalol.  3.  Dyslipidemia. -Statin therapy will be resumed.  4.  Gout. -Continue colchicine.  5.  Type diabetes mellitus. -She will be placed on supplement coverage with NovoLog. -We will hold off Actos.  6.  DVT prophylaxis. -Subcutaneous Lovenox.  All the records are reviewed and case discussed with ED provider. The plan of care was discussed in details with the patient (and family). I answered all questions. The patient agreed to proceed with the above mentioned plan. Further management will depend upon hospital course.   CODE STATUS: Full code  Status is: Inpatient  Remains inpatient appropriate because:Hemodynamically unstable, Altered mental status, Ongoing diagnostic testing needed not appropriate for outpatient work up, Unsafe d/c plan, IV treatments appropriate due to intensity of illness or inability to take PO and Inpatient level of care appropriate due to severity of illness   Dispo: The patient is from: Home              Anticipated d/c is to: Home              Anticipated d/c date is: 3 days              Patient currently is not medically stable to d/c.  Authorized and performed by: Eugenie Norrie, MD Total critical care time: Approximately  60     minutes. Due to a high probability of clinically significant, life-threatening deterioration, the patient required my highest level of preparedness to intervene emergently and I personally spent this critical care time directly and personally managing the patient.  This critical care time included obtaining a history, examining the patient, pulse oximetry, ordering and review of studies, arranging urgent treatment with development of management plan, evaluation  of patient's response to treatment, frequent reassessment, and discussions with other providers. This critical care time was performed to assess and manage the high probability of imminent, life-threatening deterioration that could result in multiorgan failure.  It was exclusive of separately billable procedures and treating other patients and teaching time.  Please see MDM section and the rest of the note for further information on patient assessment and treatment. TOTAL CRITICAL CARE TIME TAKING CARE OF THIS PATIENT: 60 minutes.    Christel Mormon M.D on 05/11/2020 at 12:00 AM  [facility]   From 7 PM-7 AM, contact night-coverage [email or web address]  CC: Primary care physician; Hamrick, Lorin Mercy, MD

## 2020-05-11 ENCOUNTER — Other Ambulatory Visit: Payer: Self-pay

## 2020-05-11 ENCOUNTER — Inpatient Hospital Stay: Payer: Medicare Other

## 2020-05-11 ENCOUNTER — Inpatient Hospital Stay (HOSPITAL_COMMUNITY)
Admit: 2020-05-11 | Discharge: 2020-05-11 | Disposition: A | Discharge status: Home / Self Care | Payer: Medicare Other | Attending: Family Medicine | Admitting: Family Medicine

## 2020-05-11 DIAGNOSIS — R0603 Acute respiratory distress: Secondary | ICD-10-CM

## 2020-05-11 DIAGNOSIS — I5031 Acute diastolic (congestive) heart failure: Secondary | ICD-10-CM | POA: Diagnosis not present

## 2020-05-11 DIAGNOSIS — J9601 Acute respiratory failure with hypoxia: Secondary | ICD-10-CM

## 2020-05-11 LAB — BLOOD GAS, ARTERIAL
Acid-Base Excess: 8 mmol/L — ABNORMAL HIGH (ref 0.0–2.0)
Bicarbonate: 36.4 mmol/L — ABNORMAL HIGH (ref 20.0–28.0)
Delivery systems: POSITIVE
Expiratory PAP: 8
FIO2: 0.6
Inspiratory PAP: 16
Mechanical Rate: 12
O2 Saturation: 93.8 %
Patient temperature: 37
pCO2 arterial: 74 mmHg (ref 32.0–48.0)
pH, Arterial: 7.3 — ABNORMAL LOW (ref 7.350–7.450)
pO2, Arterial: 77 mmHg — ABNORMAL LOW (ref 83.0–108.0)

## 2020-05-11 LAB — HEMOGLOBIN A1C
Hgb A1c MFr Bld: 7.6 % — ABNORMAL HIGH (ref 4.8–5.6)
Mean Plasma Glucose: 171 mg/dL

## 2020-05-11 LAB — PROCALCITONIN: Procalcitonin: <0.1 ng/mL

## 2020-05-11 LAB — BASIC METABOLIC PANEL
Anion gap: 9 (ref 5–15)
BUN: 38 mg/dL — ABNORMAL HIGH (ref 8–23)
CO2: 34 mmol/L — ABNORMAL HIGH (ref 22–32)
Calcium: 8.9 mg/dL (ref 8.9–10.3)
Chloride: 96 mmol/L — ABNORMAL LOW (ref 98–111)
Creatinine, Ser: 1.9 mg/dL — ABNORMAL HIGH (ref 0.44–1.00)
GFR, Estimated: 26 mL/min — ABNORMAL LOW (ref >60)
Glucose, Bld: 248 mg/dL — ABNORMAL HIGH (ref 70–99)
Potassium: 4.5 mmol/L (ref 3.5–5.1)
Sodium: 139 mmol/L (ref 135–145)

## 2020-05-11 LAB — ECHOCARDIOGRAM COMPLETE
AR max vel: 2.1 cm2
AV Area VTI: 2.45 cm2
AV Area mean vel: 2.57 cm2
AV Mean grad: 4 mmHg
AV Peak grad: 8.9 mmHg
Ao pk vel: 1.49 m/s
Area-P 1/2: 6.17 cm2
Height: 63 in
S' Lateral: 2.98 cm
Weight: 3679.04 oz

## 2020-05-11 LAB — CBC
HCT: 34 % — ABNORMAL LOW (ref 36.0–46.0)
Hemoglobin: 9.9 g/dL — ABNORMAL LOW (ref 12.0–15.0)
MCH: 28.9 pg (ref 26.0–34.0)
MCHC: 29.1 g/dL — ABNORMAL LOW (ref 30.0–36.0)
MCV: 99.4 fL (ref 80.0–100.0)
Platelets: 213 10*3/uL (ref 150–400)
RBC: 3.42 MIL/uL — ABNORMAL LOW (ref 3.87–5.11)
RDW: 13.9 % (ref 11.5–15.5)
WBC: 5.7 10*3/uL (ref 4.0–10.5)
nRBC: 0 % (ref 0.0–0.2)

## 2020-05-11 LAB — CREATININE, SERUM
Creatinine, Ser: 1.96 mg/dL — ABNORMAL HIGH (ref 0.44–1.00)
GFR, Estimated: 25 mL/min — ABNORMAL LOW (ref >60)

## 2020-05-11 LAB — GLUCOSE, CAPILLARY
Glucose-Capillary: 157 mg/dL — ABNORMAL HIGH (ref 70–99)
Glucose-Capillary: 186 mg/dL — ABNORMAL HIGH (ref 70–99)
Glucose-Capillary: 189 mg/dL — ABNORMAL HIGH (ref 70–99)
Glucose-Capillary: 235 mg/dL — ABNORMAL HIGH (ref 70–99)
Glucose-Capillary: 245 mg/dL — ABNORMAL HIGH (ref 70–99)

## 2020-05-11 LAB — LACTIC ACID, PLASMA: Lactic Acid, Venous: 1.3 mmol/L (ref 0.5–1.9)

## 2020-05-11 LAB — MAGNESIUM: Magnesium: 2.4 mg/dL (ref 1.7–2.4)

## 2020-05-11 LAB — TROPONIN I (HIGH SENSITIVITY)
Troponin I (High Sensitivity): 10 ng/L (ref <18)
Troponin I (High Sensitivity): 10 ng/L (ref <18)

## 2020-05-11 MED ORDER — INSULIN ASPART 100 UNIT/ML ~~LOC~~ SOLN
0.0000 [IU] | SUBCUTANEOUS | Status: DC
  Administered 2020-05-11: 5 [IU] via SUBCUTANEOUS
  Administered 2020-05-11: 3 [IU] via SUBCUTANEOUS
  Administered 2020-05-11: 5 [IU] via SUBCUTANEOUS
  Administered 2020-05-12: 8 [IU] via SUBCUTANEOUS
  Administered 2020-05-12: 3 [IU] via SUBCUTANEOUS
  Administered 2020-05-12: 5 [IU] via SUBCUTANEOUS
  Administered 2020-05-12: 8 [IU] via SUBCUTANEOUS
  Administered 2020-05-13 (×2): 2 [IU] via SUBCUTANEOUS
  Administered 2020-05-13 (×2): 8 [IU] via SUBCUTANEOUS
  Administered 2020-05-13 – 2020-05-14 (×3): 3 [IU] via SUBCUTANEOUS
  Administered 2020-05-14: 5 [IU] via SUBCUTANEOUS
  Administered 2020-05-14: 3 [IU] via SUBCUTANEOUS
  Filled 2020-05-11 (×16): qty 1

## 2020-05-11 MED ORDER — INSULIN ASPART 100 UNIT/ML ~~LOC~~ SOLN: 0.0000 [IU] | Freq: Every day | SUBCUTANEOUS | Status: DC

## 2020-05-11 MED ORDER — INSULIN ASPART 100 UNIT/ML ~~LOC~~ SOLN: 0.0000 [IU] | Freq: Three times a day (TID) | SUBCUTANEOUS | Status: DC

## 2020-05-11 MED ORDER — ENOXAPARIN SODIUM 40 MG/0.4ML ~~LOC~~ SOLN
40.0000 mg | SUBCUTANEOUS | Status: DC
  Administered 2020-05-12 – 2020-05-14 (×3): 40 mg via SUBCUTANEOUS
  Filled 2020-05-11 (×3): qty 0.4

## 2020-05-11 MED ORDER — PERFLUTREN LIPID MICROSPHERE
1.0000 mL | INTRAVENOUS | Status: AC | PRN
  Administered 2020-05-11: 2 mL via INTRAVENOUS
  Filled 2020-05-11: qty 10

## 2020-05-11 NOTE — Plan of Care (Signed)

## 2020-05-11 NOTE — Progress Notes (Signed)
PROGRESS NOTE    Pamela Cuevas  CWC:[id number] DOB: 06/20/1936 DOA: 05/10/2020 PCP: Leonides Sake, MD   Brief Narrative: Taken from H&P Pamela Cuevas  is a 84 y.o. Caucasian female with a known history of type 2 diabetes mellitus, hypertension, osteoarthritis and recent pericarditis for which she was hospitalized at [facility] few days ago, who presented to the emergency room with acute onset of worsening dyspnea with associated cough but has been mainly dry as well as wheezing over the last couple of days no significant deterioration today.  She denied any fever or chills.  She denied any more chest pain.  No nausea or vomiting or abdominal pain.  No dysuria, oliguria or hematuria or flank pain.  On presentation she was tachycardic, tachypneic, hypoxic and hypertensive.    Initial ABG with pH of 7.26, PCO2 of 95 and PO2 of 17 on room air.  BUN of 36 and creatinine of 1.3 with no recent lab records.  COVID-19 and influenza labs negative.  Chest x-ray with cardiomegaly, pulmonary edema and small pleural effusions.  She received 80 mg of IV Lasix, started on IV nitroglycerin drip and BiPAP.  Subjective: Patient was very somnolent when seen during morning rounds.  Able to walk her up with deep sternal rub but she was not following commands.  She was getting her echocardiogram in her room.  Remain on BiPAP.  Appears comfortable. Later per nursing staff she woke up, talking and following command and asking for food.  Assessment & Plan:   Active Problems:   Acute CHF (congestive heart failure) (HCC)   Acute respiratory failure with hypoxia and hypercapnia (HCC)   Respiratory distress  Acute on chronic hypoxic and hypercarbic respiratory failure/COPD exacerbation/CHF exacerbation.  On presentation she was hypoxic and hypercapnic requiring BiPAP.   According to her husband she uses 2 L of oxygen at home.  Does not follow-up with any cardiologist or pulmonologist and have not seen her  primary care physician for a long time.  Not very compliant with oxygen use but he try to keep her on as much as possible.  Quit smoking many years ago.  Recently seen at [facility] with diagnosis of pericarditis.  No chest pain. Echocardiogram done today with normal EF, undetermined diastolic function and less than 50% IVC collapsibility.  Troponin remain negative. Cardiology was consulted-appreciate their recommendations. -Wean off from BiPAP as tolerated. -Discontinue nitro infusion as blood pressure is becoming little soft. -We will check procalcitonin-if negative will stop ceftriaxone as there is no leukocytosis and patient remained afebrile.  No recent worsening of her cough by husband. -Continue with Zithromax. -Continue with Solu-Medrol and DuoNeb. -Continue with IV Lasix. -Strict intake and output. -Daily weight and BMP.  Hypertensive urgency.  Initially requiring nitroglycerin infusion. Discontinue as blood pressure is becoming little soft.  She was not on any antihypertensives at home. -Monitor today and if started going up we will start her on losartan. -Continue with as needed IV labetalol.  AKI/elevated creatinine.  No baseline, most likely some element of CKD as patient has uncontrolled diabetes. -Continue to monitor. -Renal ultrasound. -Avoid nephrotoxins  Type 2 diabetes mellitus.  No A1c in chart, CBG elevated.  Patient was on Actos at home. -Start her on sliding scale. -Check A1c. -She should be on SGLT2 like Farxiga or Jardiance instead of Actos due to CHF.  Dyslipidemia. -Continue home dose of statin.  Recent pericarditis/gout.  Patient was on colchicine, husband not sure how long she is taking  it may be started during recent hospital visit where she was diagnosed with pericarditis.  No more chest pain and echocardiogram without any pericardial effusion or sign of inflammation. -Continue colchicine for now.  Objective: Vitals:   05/11/20 1200 05/11/20  1216 05/11/20 1231 05/11/20 1246  BP: (!) 109/59 104/65 117/68 125/64  Pulse: 98 97 93 96  Resp: (!) 23     Temp: 98 F (36.7 C)     TempSrc: Axillary     SpO2: 96% 97% 95% 95%  Weight:      Height:        Intake/Output Summary (Last 24 hours) at 05/11/2020 1303 Last data filed at 05/11/2020 0915 Gross per 24 hour  Intake 100 ml  Output 650 ml  Net -550 ml   Filed Weights   05/10/20 2154  Weight: 104.3 kg    Examination:  General exam: Morbidly obese elderly lady, on BiPAP, in no acute distress. Respiratory system: Decreased breath sound at bases, difficult auscultation due to body habitus, respiratory effort normal. Cardiovascular system: S1 & S2 heard, RRR.  Gastrointestinal system: Soft, nontender, nondistended, bowel sounds positive. Central nervous system: Somnolent, easily arousable, not following much commands. Extremities: 1+ LE edema, no cyanosis, pulses intact and symmetrical. Psychiatry: Judgement and insight appear impaired   DVT prophylaxis: Lovenox Code Status: Full Family Communication: Discussed with husband on phone. Disposition Plan:  Status is: Inpatient  Remains inpatient appropriate because:Inpatient level of care appropriate due to severity of illness   Dispo: The patient is from: Home              Anticipated d/c is to: Home              Anticipated d/c date is: 2 days              Patient currently is not medically stable to d/c.   Consultants:   Cardiology  Procedures:  Antimicrobials:  Rocephin Zithromax  Data Reviewed: I have personally reviewed following labs and imaging studies  CBC: Recent Labs  Lab 05/11/20 0435  WBC 5.7  HGB 9.9*  HCT 34.0*  MCV 99.4  PLT 314   Basic Metabolic Panel: Recent Labs  Lab 05/10/20 2141 05/10/20 2147 05/11/20 0041 05/11/20 0435  NA 140  --   --  139  K 4.2  --   --  4.5  CL 94*  --   --  96*  CO2 38*  --   --  34*  GLUCOSE 251*  --   --  248*  BUN 36*  --   --  38*    CREATININE 1.83*  --  1.96* 1.90*  CALCIUM 9.2  --   --  8.9  MG  --  2.4  --   --    GFR: Estimated Creatinine Clearance: 26.4 mL/min (A) (by C-G formula based on SCr of 1.9 mg/dL (H)). Liver Function Tests: Recent Labs  Lab 05/10/20 2141  AST 30  ALT 28  ALKPHOS 122  BILITOT 0.7  PROT 8.2*  ALBUMIN 3.5   No results for input(s): LIPASE, AMYLASE in the last 168 hours. No results for input(s): AMMONIA in the last 168 hours. Coagulation Profile: No results for input(s): INR, PROTIME in the last 168 hours. Cardiac Enzymes: No results for input(s): CKTOTAL, CKMB, CKMBINDEX, TROPONINI in the last 168 hours. BNP (last 3 results) No results for input(s): PROBNP in the last 8760 hours. HbA1C: No results for input(s): HGBA1C in the last 72 hours.  CBG: Recent Labs  Lab 05/11/20 0115 05/11/20 1206  GLUCAP 186* 235*   Lipid Profile: No results for input(s): CHOL, HDL, LDLCALC, TRIG, CHOLHDL, LDLDIRECT in the last 72 hours. Thyroid Function Tests: No results for input(s): TSH, T4TOTAL, FREET4, T3FREE, THYROIDAB in the last 72 hours. Anemia Panel: No results for input(s): VITAMINB12, FOLATE, FERRITIN, TIBC, IRON, RETICCTPCT in the last 72 hours. Sepsis Labs: Recent Labs  Lab 05/10/20 2141 05/11/20 0041  LATICACIDVEN 0.8 1.3    Recent Results (from the past 240 hour(s))  Respiratory Panel by RT PCR (Flu A&B, Covid) - Nasopharyngeal Swab     Status: None   Collection Time: 05/10/20  9:41 PM   Specimen: Nasopharyngeal Swab  Result Value Ref Range Status   SARS Coronavirus 2 by RT PCR NEGATIVE NEGATIVE Final    Comment: (NOTE) SARS-CoV-2 target nucleic acids are NOT DETECTED.  The SARS-CoV-2 RNA is generally detectable in upper respiratoy specimens during the acute phase of infection. The lowest concentration of SARS-CoV-2 viral copies this assay can detect is 131 copies/mL. A negative result does not preclude SARS-Cov-2 infection and should not be used as the sole basis  for treatment or other patient management decisions. A negative result may occur with  improper specimen collection/handling, submission of specimen other than nasopharyngeal swab, presence of viral mutation(s) within the areas targeted by this assay, and inadequate number of viral copies (<131 copies/mL). A negative result must be combined with clinical observations, patient history, and epidemiological information. The expected result is Negative.  Fact Sheet for Patients:  PinkCheek.be  Fact Sheet for Healthcare Providers:  GravelBags.it  This test is no t yet approved or cleared by the [place] FDA and  has been authorized for detection and/or diagnosis of SARS-CoV-2 by FDA under an Emergency Use Authorization (EUA). This EUA will remain  in effect (meaning this test can be used) for the duration of the COVID-19 declaration under Section 564(b)(1) of the Act, 21 U.S.C. section 360bbb-3(b)(1), unless the authorization is terminated or revoked sooner.     Influenza A by PCR NEGATIVE NEGATIVE Final   Influenza B by PCR NEGATIVE NEGATIVE Final    Comment: (NOTE) The Xpert Xpress SARS-CoV-2/FLU/RSV assay is intended as an aid in  the diagnosis of influenza from Nasopharyngeal swab specimens and  should not be used as a sole basis for treatment. Nasal washings and  aspirates are unacceptable for Xpert Xpress SARS-CoV-2/FLU/RSV  testing.  Fact Sheet for Patients: PinkCheek.be  Fact Sheet for Healthcare Providers: GravelBags.it  This test is not yet approved or cleared by the [place] FDA and  has been authorized for detection and/or diagnosis of SARS-CoV-2 by  FDA under an Emergency Use Authorization (EUA). This EUA will remain  in effect (meaning this test can be used) for the duration of the  Covid-19 declaration under Section 564(b)(1) of the Act, 21    U.S.C. section 360bbb-3(b)(1), unless the authorization is  terminated or revoked. Performed at [facility], [address], [place], [place] [address]      Radiology Studies: DG Chest Portable 1 View  Result Date: 05/10/2020 CLINICAL DATA:  Respiratory distress EXAM: PORTABLE CHEST 1 VIEW COMPARISON:  05/05/2020 FINDINGS: Moderate cardiomegaly with small pleural effusions and mild pulmonary edema. IMPRESSION: Cardiomegaly with small pleural effusions and mild pulmonary edema. Electronically Signed   By: Ulyses Jarred M.D.   On: 05/10/2020 23:01   ECHOCARDIOGRAM COMPLETE  Result Date: 05/11/2020    ECHOCARDIOGRAM REPORT   Patient Name:  Pamela Cuevas Date of Exam: 05/11/2020 Medical Rec #:  [id number]          Height:       63.0 in Accession #:    [id number]         Weight:       229.9 lb Date of Birth:  Jun 11, 1936         BSA:          2.052 m Patient Age:    89 years           BP:           131/55 mmHg Patient Gender: F                  HR:           100 bpm. Exam Location:  [facility] Procedure: 2D Echo, Color Doppler, Cardiac Doppler and Intracardiac            Opacification Agent Indications:     I50.31 CHF-Acute Diastolic  History:         Patient has no prior history of Echocardiogram examinations.                  Risk Factors:Hypertension and Diabetes.  Sonographer:     Charmayne Sheer RDCS (AE) Referring Phys:  [id number] [place] Diagnosing Phys: Kathlyn Sacramento MD  Sonographer Comments: Technically difficult study due to poor echo windows. TDS due to usage of BIPAP and Image acquisition challenging due to patient body habitus. IMPRESSIONS  1. Left ventricular ejection fraction, by estimation, is 60 to 65%. The left ventricle has normal function. The left ventricle has no regional wall motion abnormalities. Left ventricular diastolic function could not be evaluated.  2. Right ventricular systolic function is normal. The right ventricular size is normal. Tricuspid regurgitation  signal is inadequate for assessing PA pressure.  3. Left atrial size was mildly dilated.  4. The mitral valve is normal in structure. No evidence of mitral valve regurgitation. No evidence of mitral stenosis.  5. The aortic valve is normal in structure. Aortic valve regurgitation is not visualized. No aortic stenosis is present.  6. The inferior vena cava is dilated in size with <50% respiratory variability, suggesting right atrial pressure of 15 mmHg. FINDINGS  Left Ventricle: Left ventricular ejection fraction, by estimation, is 60 to 65%. The left ventricle has normal function. The left ventricle has no regional wall motion abnormalities. Definity contrast agent was given IV to delineate the left ventricular  endocardial borders. The left ventricular internal cavity size was normal in size. There is no left ventricular hypertrophy. Left ventricular diastolic function could not be evaluated. Right Ventricle: The right ventricular size is normal. No increase in right ventricular wall thickness. Right ventricular systolic function is normal. Tricuspid regurgitation signal is inadequate for assessing PA pressure. Left Atrium: Left atrial size was mildly dilated. Right Atrium: Right atrial size was normal in size. Pericardium: There is no evidence of pericardial effusion. Mitral Valve: The mitral valve is normal in structure. Mild mitral annular calcification. No evidence of mitral valve regurgitation. No evidence of mitral valve stenosis. MV peak gradient, 4.4 mmHg. The mean mitral valve gradient is 3.0 mmHg. Tricuspid Valve: The tricuspid valve is normal in structure. Tricuspid valve regurgitation is not demonstrated. No evidence of tricuspid stenosis. Aortic Valve: The aortic valve is normal in structure. Aortic valve regurgitation is not visualized. No aortic stenosis is present. Aortic valve mean gradient measures 4.0 mmHg. Aortic valve  peak gradient measures 8.9 mmHg. Aortic valve area, by VTI measures 2.45 cm.  Pulmonic Valve: The pulmonic valve was normal in structure. Pulmonic valve regurgitation is not visualized. No evidence of pulmonic stenosis. Aorta: The aortic root is normal in size and structure. Venous: The inferior vena cava is dilated in size with less than 50% respiratory variability, suggesting right atrial pressure of 15 mmHg. IAS/Shunts: No atrial level shunt detected by color flow Doppler. Additional Comments: There is a small pleural effusion in the left lateral region.  LEFT VENTRICLE PLAX 2D LVIDd:         4.51 cm  Diastology LVIDs:         2.98 cm  LV e' medial:    6.64 cm/s LV PW:         0.90 cm  LV E/e' medial:  10.8 LV IVS:        0.82 cm  LV e' lateral:   9.03 cm/s LVOT diam:     2.00 cm  LV E/e' lateral: 7.9 LV SV:         52 LV SV Index:   25 LVOT Area:     3.14 cm  LEFT ATRIUM           Index LA diam:      4.00 cm 1.95 cm/m LA Vol (A4C): 31.2 ml 15.21 ml/m  AORTIC VALVE                   PULMONIC VALVE AV Area (Vmax):    2.10 cm    PV Vmax:       1.06 m/s AV Area (Vmean):   2.57 cm    PV Vmean:      57.900 cm/s AV Area (VTI):     2.45 cm    PV VTI:        0.112 m AV Vmax:           149.00 cm/s PV Peak grad:  4.5 mmHg AV Vmean:          89.300 cm/s PV Mean grad:  2.0 mmHg AV VTI:            0.213 m AV Peak Grad:      8.9 mmHg AV Mean Grad:      4.0 mmHg LVOT Vmax:         99.80 cm/s LVOT Vmean:        73.100 cm/s LVOT VTI:          0.166 m LVOT/AV VTI ratio: 0.78  AORTA Ao Root diam: 2.80 cm MITRAL VALVE MV Area (PHT): 6.17 cm    SHUNTS MV Peak grad:  4.4 mmHg    Systemic VTI:  0.17 m MV Mean grad:  3.0 mmHg    Systemic Diam: 2.00 cm MV Vmax:       1.05 m/s MV Vmean:      76.6 cm/s MV Decel Time: 123 msec MV E velocity: 71.60 cm/s MV A velocity: 81.40 cm/s MV E/A ratio:  0.88 Kathlyn Sacramento MD Electronically signed by Kathlyn Sacramento MD Signature Date/Time: 05/11/2020/11:48:04 AM    Final     Scheduled Meds: . albuterol  10 mg/hr Nebulization Once  . atorvastatin  5 mg Oral Daily  .  colchicine  0.6 mg Oral QODAY  . enoxaparin (LOVENOX) injection  40 mg Subcutaneous Q24H  . furosemide  60 mg Intravenous BID  . guaiFENesin  600 mg Oral BID  . insulin aspart  0-15 Units Subcutaneous Q4H  .  ipratropium  0.5 mg Nebulization Once  . ipratropium-albuterol  3 mL Nebulization QID  . methylPREDNISolone (SOLU-MEDROL) injection  40 mg Intravenous Q8H   Followed by  . [START ON 05/12/2020] predniSONE  40 mg Oral Q breakfast  . [START ON 05/12/2020] Vitamin D (Ergocalciferol)  50,000 Units Oral Q Tue   Continuous Infusions: . cefTRIAXone (ROCEPHIN)  IV Stopped (05/11/20 0600)  . nitroGLYCERIN Stopped (05/11/20 1225)     LOS: 1 day   Time spent: 40 minutes.  Lorella Nimrod, MD [facility]  If 7PM-7AM, please contact night-coverage [email or web address]  05/11/2020, 1:03 PM   This record has been created using Systems analyst. Errors have been sought and corrected,but may not always be located. Such creation errors do not reflect on the standard of care.

## 2020-05-11 NOTE — Progress Notes (Signed)
Inpatient Diabetes Program Recommendations  AACE/ADA: New Consensus Statement on Inpatient Glycemic Control (2015)  Target Ranges:  Prepandial:   less than 140 mg/dL      Peak postprandial:   less than 180 mg/dL (1-2 hours)      Critically ill patients:  140 - 180 mg/dL   Lab Results  Component Value Date   GLUCAP 235 (H) 05/11/2020   HGBA1C (H) 05/26/2009    6.9 (NOTE) The ADA recommends the following therapeutic goal for glycemic control related to Hgb A1c measurement: Goal of therapy: <6.5 Hgb A1c  Reference: American Diabetes Association: Clinical Practice Recommendations 2010, Diabetes Care, 2010, 33: (Suppl  1).    Review of Glycemic Control Results for SELEN, SMUCKER (MRN [id number]) as of 05/11/2020 12:24  Ref. Range 05/11/2020 01:15 05/11/2020 12:06  Glucose-Capillary Latest Ref Range: 70 - 99 mg/dL 186 (H) 235 (H)   Diabetes history: DM 2 Outpatient Diabetes medications:  Actos 30 mg daily Current orders for Inpatient glycemic control:  Prednisone 40 mg daily (05/12/20-05/16/20) Solumedrol 40 mg IV q 8 hours Novolog moderate q 4 hours Inpatient Diabetes Program Recommendations:     Note that patient was Actos prior to admit.  May consider not restarting Actos due to history of CHF?  Thanks,  Adah Perl, RN, BC-ADM Inpatient Diabetes Coordinator Pager [phone] (8a-5p)

## 2020-05-11 NOTE — Progress Notes (Signed)
*  PRELIMINARY RESULTS* Echocardiogram 2D Echocardiogram has been performed.  Pamela Cuevas 05/11/2020, 10:58 AM

## 2020-05-11 NOTE — Progress Notes (Signed)
PROGRESS NOTE    Pamela Cuevas  JSE:[id number] DOB: October 08, 1935 DOA: 05/10/2020 PCP: Leonides Sake, MD   Brief Narrative: Taken from H&P Pamela Cuevas  is a 84 y.o. Caucasian female with a known history of type 2 diabetes mellitus, hypertension, osteoarthritis and recent pericarditis for which she was hospitalized at [facility] few days ago, who presented to the emergency room with acute onset of worsening dyspnea with associated cough but has been mainly dry as well as wheezing over the last couple of days no significant deterioration today.  She denied any fever or chills.  She denied any more chest pain.  No nausea or vomiting or abdominal pain.  No dysuria, oliguria or hematuria or flank pain.  On presentation she was tachycardic, tachypneic, hypoxic and hypertensive.    Initial ABG with pH of 7.26, PCO2 of 95 and PO2 of 17 on room air.  BUN of 36 and creatinine of 1.3 with no recent lab records.  COVID-19 and influenza labs negative.  Chest x-ray with cardiomegaly, pulmonary edema and small pleural effusions.  She received 80 mg of IV Lasix, started on IV nitroglycerin drip and BiPAP.  Subjective: Patient was very somnolent when seen during morning rounds.  Able to walk her up with deep sternal rub but she was not following commands.  She was getting her echocardiogram in her room.  Remain on BiPAP.  Appears comfortable. Later per nursing staff she woke up, talking and following command and asking for food.  Assessment & Plan:   Active Problems:   Acute CHF (congestive heart failure) (HCC)  Acute on chronic hypoxic and hypercarbic respiratory failure/COPD exacerbation/CHF exacerbation.  On presentation she was hypoxic and hypercapnic requiring BiPAP.   According to her husband she uses 2 L of oxygen at home.  Does not follow-up with any cardiologist or pulmonologist and have not seen her primary care physician for a long time.  Not very compliant with oxygen use but he try  to keep her on as much as possible.  Quit smoking many years ago.  Recently seen at [facility] with diagnosis of pericarditis.  No chest pain. Echocardiogram done today with normal EF, undetermined diastolic function and less than 50% IVC collapsibility. Cardiology was consulted-appreciate their recommendations. -Wean off from BiPAP as tolerated. -Discontinue nitro infusion as blood pressure is becoming little soft. -We will check procalcitonin-if negative will stop ceftriaxone as there is no leukocytosis and patient remained afebrile.  No recent worsening of her cough by husband. -Continue with Zithromax. -Continue with Solu-Medrol and DuoNeb. -Continue with IV Lasix. -Strict intake and output. -Daily weight and BMP.  Hypertensive urgency.  Initially requiring nitroglycerin infusion. Discontinue as blood pressure is becoming little soft.  She was not on any antihypertensives at home. -Monitor today and if started going up we will start her on losartan. -Continue with as needed IV labetalol.  Type 2 diabetes mellitus.  No A1c in chart, CBG elevated.  Patient was on Actos at home. -Start her on sliding scale. -Check A1c. -She should be on SGLT2 like Farxiga or Jardiance instead of Actos due to CHF.  Dyslipidemia. -Continue home dose of statin.  Recent pericarditis/gout.  Patient was on colchicine, husband not sure how long she is taking it may be started during recent hospital visit where she was diagnosed with pericarditis.  No more chest pain and echocardiogram without any pericardial effusion or sign of inflammation. -Continue colchicine for now.  Objective: Vitals:   05/11/20 1131 05/11/20 1131  05/11/20 1146 05/11/20 1200  BP:  (!) 105/56 112/61 (!) 109/59  Pulse:  99 95 98  Resp:    (!) 23  Temp:    98 F (36.7 C)  TempSrc:    Axillary  SpO2: 96% 100% 95% 96%  Weight:      Height:        Intake/Output Summary (Last 24 hours) at 05/11/2020 1229 Last data filed at  05/11/2020 0915 Gross per 24 hour  Intake 100 ml  Output 650 ml  Net -550 ml   Filed Weights   05/10/20 2154  Weight: 104.3 kg    Examination:  General exam: Morbidly obese elderly lady, on BiPAP, in no acute distress. Respiratory system: Decreased breath sound at bases, difficult auscultation due to body habitus, respiratory effort normal. Cardiovascular system: S1 & S2 heard, RRR.  Gastrointestinal system: Soft, nontender, nondistended, bowel sounds positive. Central nervous system: Somnolent, easily arousable, not following much commands. Extremities: 1+ LE edema, no cyanosis, pulses intact and symmetrical. Psychiatry: Judgement and insight appear impaired   DVT prophylaxis: Lovenox Code Status: Full Family Communication: Discussed with husband on phone. Disposition Plan:  Status is: Inpatient  Remains inpatient appropriate because:Inpatient level of care appropriate due to severity of illness   Dispo: The patient is from: Home              Anticipated d/c is to: Home              Anticipated d/c date is: 2 days              Patient currently is not medically stable to d/c.   Consultants:   Cardiology  Procedures:  Antimicrobials:  Rocephin Zithromax  Data Reviewed: I have personally reviewed following labs and imaging studies  CBC: Recent Labs  Lab 05/11/20 0435  WBC 5.7  HGB 9.9*  HCT 34.0*  MCV 99.4  PLT 300   Basic Metabolic Panel: Recent Labs  Lab 05/10/20 2141 05/10/20 2147 05/11/20 0041 05/11/20 0435  NA 140  --   --  139  K 4.2  --   --  4.5  CL 94*  --   --  96*  CO2 38*  --   --  34*  GLUCOSE 251*  --   --  248*  BUN 36*  --   --  38*  CREATININE 1.83*  --  1.96* 1.90*  CALCIUM 9.2  --   --  8.9  MG  --  2.4  --   --    GFR: Estimated Creatinine Clearance: 26.4 mL/min (A) (by C-G formula based on SCr of 1.9 mg/dL (H)). Liver Function Tests: Recent Labs  Lab 05/10/20 2141  AST 30  ALT 28  ALKPHOS 122  BILITOT 0.7  PROT  8.2*  ALBUMIN 3.5   No results for input(s): LIPASE, AMYLASE in the last 168 hours. No results for input(s): AMMONIA in the last 168 hours. Coagulation Profile: No results for input(s): INR, PROTIME in the last 168 hours. Cardiac Enzymes: No results for input(s): CKTOTAL, CKMB, CKMBINDEX, TROPONINI in the last 168 hours. BNP (last 3 results) No results for input(s): PROBNP in the last 8760 hours. HbA1C: No results for input(s): HGBA1C in the last 72 hours. CBG: Recent Labs  Lab 05/11/20 0115 05/11/20 1206  GLUCAP 186* 235*   Lipid Profile: No results for input(s): CHOL, HDL, LDLCALC, TRIG, CHOLHDL, LDLDIRECT in the last 72 hours. Thyroid Function Tests: No results for input(s): TSH, T4TOTAL, FREET4,  T3FREE, THYROIDAB in the last 72 hours. Anemia Panel: No results for input(s): VITAMINB12, FOLATE, FERRITIN, TIBC, IRON, RETICCTPCT in the last 72 hours. Sepsis Labs: Recent Labs  Lab 05/10/20 2141 05/11/20 0041  LATICACIDVEN 0.8 1.3    Recent Results (from the past 240 hour(s))  Respiratory Panel by RT PCR (Flu A&B, Covid) - Nasopharyngeal Swab     Status: None   Collection Time: 05/10/20  9:41 PM   Specimen: Nasopharyngeal Swab  Result Value Ref Range Status   SARS Coronavirus 2 by RT PCR NEGATIVE NEGATIVE Final    Comment: (NOTE) SARS-CoV-2 target nucleic acids are NOT DETECTED.  The SARS-CoV-2 RNA is generally detectable in upper respiratoy specimens during the acute phase of infection. The lowest concentration of SARS-CoV-2 viral copies this assay can detect is 131 copies/mL. A negative result does not preclude SARS-Cov-2 infection and should not be used as the sole basis for treatment or other patient management decisions. A negative result may occur with  improper specimen collection/handling, submission of specimen other than nasopharyngeal swab, presence of viral mutation(s) within the areas targeted by this assay, and inadequate number of viral copies (<131  copies/mL). A negative result must be combined with clinical observations, patient history, and epidemiological information. The expected result is Negative.  Fact Sheet for Patients:  PinkCheek.be  Fact Sheet for Healthcare Providers:  GravelBags.it  This test is no t yet approved or cleared by the [place] FDA and  has been authorized for detection and/or diagnosis of SARS-CoV-2 by FDA under an Emergency Use Authorization (EUA). This EUA will remain  in effect (meaning this test can be used) for the duration of the COVID-19 declaration under Section 564(b)(1) of the Act, 21 U.S.C. section 360bbb-3(b)(1), unless the authorization is terminated or revoked sooner.     Influenza A by PCR NEGATIVE NEGATIVE Final   Influenza B by PCR NEGATIVE NEGATIVE Final    Comment: (NOTE) The Xpert Xpress SARS-CoV-2/FLU/RSV assay is intended as an aid in  the diagnosis of influenza from Nasopharyngeal swab specimens and  should not be used as a sole basis for treatment. Nasal washings and  aspirates are unacceptable for Xpert Xpress SARS-CoV-2/FLU/RSV  testing.  Fact Sheet for Patients: PinkCheek.be  Fact Sheet for Healthcare Providers: GravelBags.it  This test is not yet approved or cleared by the [place] FDA and  has been authorized for detection and/or diagnosis of SARS-CoV-2 by  FDA under an Emergency Use Authorization (EUA). This EUA will remain  in effect (meaning this test can be used) for the duration of the  Covid-19 declaration under Section 564(b)(1) of the Act, 21  U.S.C. section 360bbb-3(b)(1), unless the authorization is  terminated or revoked. Performed at [facility], [address] [place], [place] [address]      Radiology Studies: DG Chest Portable 1 View  Result Date: 05/10/2020 CLINICAL DATA:  Respiratory distress EXAM: PORTABLE  CHEST 1 VIEW COMPARISON:  05/05/2020 FINDINGS: Moderate cardiomegaly with small pleural effusions and mild pulmonary edema. IMPRESSION: Cardiomegaly with small pleural effusions and mild pulmonary edema. Electronically Signed   By: Ulyses Jarred M.D.   On: 05/10/2020 23:01   ECHOCARDIOGRAM COMPLETE  Result Date: 05/11/2020    ECHOCARDIOGRAM REPORT   Patient Name:   Pamela Cuevas Date of Exam: 05/11/2020 Medical Rec #:  [id number]          Height:       63.0 in Accession #:    [id number]  Weight:       229.9 lb Date of Birth:  [birth year]         BSA:          2.052 m Patient Age:    77 years           BP:           131/55 mmHg Patient Gender: F                  HR:           100 bpm. Exam Location:  [facility] Procedure: 2D Echo, Color Doppler, Cardiac Doppler and Intracardiac            Opacification Agent Indications:     I50.31 CHF-Acute Diastolic  History:         Patient has no prior history of Echocardiogram examinations.                  Risk Factors:Hypertension and Diabetes.  Sonographer:     Charmayne Sheer RDCS (AE) Referring Phys:  [id number] [place] Diagnosing Phys: Kathlyn Sacramento MD  Sonographer Comments: Technically difficult study due to poor echo windows. TDS due to usage of BIPAP and Image acquisition challenging due to patient body habitus. IMPRESSIONS  1. Left ventricular ejection fraction, by estimation, is 60 to 65%. The left ventricle has normal function. The left ventricle has no regional wall motion abnormalities. Left ventricular diastolic function could not be evaluated.  2. Right ventricular systolic function is normal. The right ventricular size is normal. Tricuspid regurgitation signal is inadequate for assessing PA pressure.  3. Left atrial size was mildly dilated.  4. The mitral valve is normal in structure. No evidence of mitral valve regurgitation. No evidence of mitral stenosis.  5. The aortic valve is normal in structure. Aortic valve regurgitation is not visualized. No  aortic stenosis is present.  6. The inferior vena cava is dilated in size with <50% respiratory variability, suggesting right atrial pressure of 15 mmHg. FINDINGS  Left Ventricle: Left ventricular ejection fraction, by estimation, is 60 to 65%. The left ventricle has normal function. The left ventricle has no regional wall motion abnormalities. Definity contrast agent was given IV to delineate the left ventricular  endocardial borders. The left ventricular internal cavity size was normal in size. There is no left ventricular hypertrophy. Left ventricular diastolic function could not be evaluated. Right Ventricle: The right ventricular size is normal. No increase in right ventricular wall thickness. Right ventricular systolic function is normal. Tricuspid regurgitation signal is inadequate for assessing PA pressure. Left Atrium: Left atrial size was mildly dilated. Right Atrium: Right atrial size was normal in size. Pericardium: There is no evidence of pericardial effusion. Mitral Valve: The mitral valve is normal in structure. Mild mitral annular calcification. No evidence of mitral valve regurgitation. No evidence of mitral valve stenosis. MV peak gradient, 4.4 mmHg. The mean mitral valve gradient is 3.0 mmHg. Tricuspid Valve: The tricuspid valve is normal in structure. Tricuspid valve regurgitation is not demonstrated. No evidence of tricuspid stenosis. Aortic Valve: The aortic valve is normal in structure. Aortic valve regurgitation is not visualized. No aortic stenosis is present. Aortic valve mean gradient measures 4.0 mmHg. Aortic valve peak gradient measures 8.9 mmHg. Aortic valve area, by VTI measures 2.45 cm. Pulmonic Valve: The pulmonic valve was normal in structure. Pulmonic valve regurgitation is not visualized. No evidence of pulmonic stenosis. Aorta: The aortic root is normal in size and structure. Venous:  The inferior vena cava is dilated in size with less than 50% respiratory variability, suggesting  right atrial pressure of 15 mmHg. IAS/Shunts: No atrial level shunt detected by color flow Doppler. Additional Comments: There is a small pleural effusion in the left lateral region.  LEFT VENTRICLE PLAX 2D LVIDd:         4.51 cm  Diastology LVIDs:         2.98 cm  LV e' medial:    6.64 cm/s LV PW:         0.90 cm  LV E/e' medial:  10.8 LV IVS:        0.82 cm  LV e' lateral:   9.03 cm/s LVOT diam:     2.00 cm  LV E/e' lateral: 7.9 LV SV:         52 LV SV Index:   25 LVOT Area:     3.14 cm  LEFT ATRIUM           Index LA diam:      4.00 cm 1.95 cm/m LA Vol (A4C): 31.2 ml 15.21 ml/m  AORTIC VALVE                   PULMONIC VALVE AV Area (Vmax):    2.10 cm    PV Vmax:       1.06 m/s AV Area (Vmean):   2.57 cm    PV Vmean:      57.900 cm/s AV Area (VTI):     2.45 cm    PV VTI:        0.112 m AV Vmax:           149.00 cm/s PV Peak grad:  4.5 mmHg AV Vmean:          89.300 cm/s PV Mean grad:  2.0 mmHg AV VTI:            0.213 m AV Peak Grad:      8.9 mmHg AV Mean Grad:      4.0 mmHg LVOT Vmax:         99.80 cm/s LVOT Vmean:        73.100 cm/s LVOT VTI:          0.166 m LVOT/AV VTI ratio: 0.78  AORTA Ao Root diam: 2.80 cm MITRAL VALVE MV Area (PHT): 6.17 cm    SHUNTS MV Peak grad:  4.4 mmHg    Systemic VTI:  0.17 m MV Mean grad:  3.0 mmHg    Systemic Diam: 2.00 cm MV Vmax:       1.05 m/s MV Vmean:      76.6 cm/s MV Decel Time: 123 msec MV E velocity: 71.60 cm/s MV A velocity: 81.40 cm/s MV E/A ratio:  0.88 Kathlyn Sacramento MD Electronically signed by Kathlyn Sacramento MD Signature Date/Time: 05/11/2020/11:48:04 AM    Final     Scheduled Meds:  albuterol  10 mg/hr Nebulization Once   atorvastatin  5 mg Oral Daily   colchicine  0.6 mg Oral QODAY   enoxaparin (LOVENOX) injection  40 mg Subcutaneous Q24H   furosemide  60 mg Intravenous BID   guaiFENesin  600 mg Oral BID   insulin aspart  0-15 Units Subcutaneous Q4H   ipratropium  0.5 mg Nebulization Once   ipratropium-albuterol  3 mL Nebulization QID    methylPREDNISolone (SOLU-MEDROL) injection  40 mg Intravenous Q8H   Followed by   Derrill Memo ON 05/12/2020] predniSONE  40 mg Oral Q breakfast   [  START ON 05/12/2020] Vitamin D (Ergocalciferol)  50,000 Units Oral Q Tue   Continuous Infusions:  cefTRIAXone (ROCEPHIN)  IV Stopped (05/11/20 0600)   nitroGLYCERIN Stopped (05/11/20 1225)     LOS: 1 day   Time spent: 40 minutes.  Lorella Nimrod, MD [facility]  If 7PM-7AM, please contact night-coverage [email or web address]  05/11/2020, 12:29 PM   This record has been created using Systems analyst. Errors have been sought and corrected,but may not always be located. Such creation errors do not reflect on the standard of care.

## 2020-05-11 NOTE — ED Notes (Signed)
Bladder Scan 350

## 2020-05-11 NOTE — Consult Note (Signed)
[facility] Consultation Note  Patient ID: Pamela Cuevas, MRN: [id number], DOB/AGE: August 27, 1935 84 y.o. Admit date: 05/10/2020   Date of Consult: 05/11/2020 Primary Physician: Leonides Sake, MD Primary Cardiologist: None  Chief Complaint:  Chief Complaint  Patient presents with  . Shortness of Breath    Recently seen at [facility], prior diagnosis of pericarditis.    Reason for Consult: HPI    Shortness of Breath     Additional comments: Recently seen at [facility], prior diagnosis of pericarditis.        Last edited by Bennie Pierini, RN on 05/10/2020  9:38 PM. (History)       HPI: 84 y.o. female diabetes hypertension hyperlipidemia chronic kidney disease stage IV anemia who has apparently recently been to [facility] with pericarditis although currently having no evidence of EKG changes and/or apparent symptoms. The patient is poor historian and cannot get significant amount of information. She does say that she feels okay at this time. The patient does though presented with severe hypoxia shortness of breath cough and congestion. With this the patient has had a hemoglobin of 9.9 glomerular filtration rate of 27 a troponin of 10 a BNP of 180 chest x-ray with small effusions with congestive heart failure and an EKG showing normal sinus rhythm left atrial enlargement first-degree AV block otherwise poor R wave progression. The patient appears to be stable and comfortable with BiPAP due to the patient's having some hypoxia and respiratory failure. This is improving her oxygenation and symptoms. She has been placed on antibiotics for exacerbation of COPD for which she apparently has a history. In addition to that the patient has had reviewed received intravenous Lasix for which we will watch very closely for concerns of chronic kidney disease exacerbation as well as lung improvements and hypoxia. The patient will need further evaluation echocardiogram  for LV systolic dysfunction along with above and other hypertension control. The patient was on nitroglycerin intravenously for malignant hypertension which is improving at this time and may be able to be tapered off  Past Medical History:  Diagnosis Date  . Diabetes mellitus, type 2 ([place])   . Hypertension   . PE (pulmonary thromboembolism) ([place]) 2010   After knee replacement      Surgical History:  Past Surgical History:  Procedure Laterality Date  . CATARACT EXTRACTION W/PHACO Left 03/26/2020   Procedure: CATARACT EXTRACTION PHACO AND INTRAOCULAR LENS PLACEMENT ([place]) LEFT DIABETIC;  Surgeon: Birder Robson, MD;  Location: [place];  Service: Ophthalmology;  Laterality: Left;  7.89 0:44.3  . CATARACT EXTRACTION W/PHACO Right 04/21/2020   Procedure: CATARACT EXTRACTION PHACO AND INTRAOCULAR LENS PLACEMENT (I[facility]) RIGHT DIABETIC 7.50 01:12.5;  Surgeon: Birder Robson, MD;  Location: [place];  Service: Ophthalmology;  Laterality: Right;  Diabetic - oral meds  . REPLACEMENT TOTAL KNEE Left 2010     Home Meds: Prior to Admission medications   Medication Sig Start Date End Date Taking? Authorizing Provider  albuterol (VENTOLIN HFA) 108 (90 Base) MCG/ACT inhaler Inhale 1-2 puffs into the lungs every 6 (six) hours as needed for wheezing or shortness of breath.   Yes [provider]  atorvastatin (LIPITOR) 10 MG tablet Take 5 mg by mouth daily.    Yes [provider]  colchicine 0.6 MG tablet Take 0.6 mg by mouth every other day. 05/07/20  Yes [provider]  ergocalciferol (VITAMIN D2) 1.25 MG (50000 UT) capsule Take 50,000 Units by mouth every Tuesday.  Yes [provider]  Fluticasone-Salmeterol (ADVAIR DISKUS) 100-50 MCG/DOSE AEPB Inhale 1 puff into the lungs as needed.    Yes [provider]  furosemide (LASIX) 40 MG tablet Take 40 mg by mouth.   Yes [provider]  pioglitazone (ACTOS) 30 MG tablet Take 30 mg  by mouth daily.   Yes [provider]  amoxicillin-clavulanate (AUGMENTIN) 875-125 MG tablet Take 1 tablet by mouth 2 (two) times daily. 05/09/20   [provider]  methocarbamol (ROBAXIN) 500 MG tablet Take 500 mg by mouth as needed for muscle spasms.    [provider]    Inpatient Medications:  . albuterol  10 mg/hr Nebulization Once  . atorvastatin  5 mg Oral Daily  . colchicine  0.6 mg Oral QODAY  . enoxaparin (LOVENOX) injection  40 mg Subcutaneous Q24H  . furosemide  60 mg Intravenous BID  . guaiFENesin  600 mg Oral BID  . ipratropium  0.5 mg Nebulization Once  . ipratropium-albuterol  3 mL Nebulization QID  . methylPREDNISolone (SOLU-MEDROL) injection  40 mg Intravenous Q8H   Followed by  . [START ON 05/12/2020] predniSONE  40 mg Oral Q breakfast  . [START ON 05/12/2020] Vitamin D (Ergocalciferol)  50,000 Units Oral Q Tue   . cefTRIAXone (ROCEPHIN)  IV Stopped (05/11/20 0600)  . nitroGLYCERIN 50 mcg/min (05/10/20 2242)    Allergies:  Allergies  Allergen Reactions  . Aspirin Nausea Only    Social History   Socioeconomic History  . Marital status: Married    Spouse name: Not on file  . Number of children: Not on file  . Years of education: Not on file  . Highest education level: Not on file  Occupational History  . Not on file  Tobacco Use  . Smoking status: Former Smoker    Types: Cigarettes    Quit date: 2003    Years since quitting: 18.8  . Smokeless tobacco: Never Used  Vaping Use  . Vaping Use: Never used  Substance and Sexual Activity  . Alcohol use: Not Currently  . Drug use: Not on file  . Sexual activity: Not on file  Other Topics Concern  . Not on file  Social History Narrative  . Not on file   Social Determinants of Health   Financial Resource Strain:   . Difficulty of Paying Living Expenses: Not on file  Food Insecurity:   . Worried About Charity fundraiser in the Last Year: Not on file  . Ran Out of Food in  the Last Year: Not on file  Transportation Needs:   . Lack of Transportation (Medical): Not on file  . Lack of Transportation (Non-Medical): Not on file  Physical Activity:   . Days of Exercise per Week: Not on file  . Minutes of Exercise per Session: Not on file  Stress:   . Feeling of Stress : Not on file  Social Connections:   . Frequency of Communication with Friends and Family: Not on file  . Frequency of Social Gatherings with Friends and Family: Not on file  . Attends Religious Services: Not on file  . Active Member of Clubs or Organizations: Not on file  . Attends Archivist Meetings: Not on file  . Marital Status: Not on file  Intimate Partner Violence:   . Fear of Current or Ex-Partner: Not on file  . Emotionally Abused: Not on file  . Physically Abused: Not on file  . Sexually Abused: Not on file  History reviewed. No pertinent family history.   Review of Systems Positive for shortness of breath Patient has difficulty with conversation and otherwise says she feels fine  Labs: No results for input(s): CKTOTAL, CKMB, TROPONINI in the last 72 hours. Lab Results  Component Value Date   WBC 5.7 05/11/2020   HGB 9.9 (L) 05/11/2020   HCT 34.0 (L) 05/11/2020   MCV 99.4 05/11/2020   PLT 213 05/11/2020    Recent Labs  Lab 05/10/20 2141 05/11/20 0041 05/11/20 0435  NA 140   < > 139  K 4.2   < > 4.5  CL 94*   < > 96*  CO2 38*   < > 34*  BUN 36*   < > 38*  CREATININE 1.83*   < > 1.90*  CALCIUM 9.2   < > 8.9  PROT 8.2*  --   --   BILITOT 0.7  --   --   ALKPHOS 122  --   --   ALT 28  --   --   AST 30  --   --   GLUCOSE 251*   < > 248*   < > = values in this interval not displayed.   No results found for: CHOL, HDL, LDLCALC, TRIG No results found for: DDIMER  Radiology/Studies:  DG Chest Portable 1 View  Result Date: 05/10/2020 CLINICAL DATA:  Respiratory distress EXAM: PORTABLE CHEST 1 VIEW COMPARISON:  05/05/2020 FINDINGS: Moderate  cardiomegaly with small pleural effusions and mild pulmonary edema. IMPRESSION: Cardiomegaly with small pleural effusions and mild pulmonary edema. Electronically Signed   By: Ulyses Jarred M.D.   On: 05/10/2020 23:01    EKG: Sinus rhythm with left atrial enlargement first-degree AV block and poor R wave progression  Weights: Filed Weights   05/10/20 2154  Weight: 104.3 kg     Physical Exam: Blood pressure 131/64, pulse (!) 107, temperature 98.7 F (37.1 C), temperature source Axillary, resp. rate (!) 26, height 5\' 3"  (1.6 m), weight 104.3 kg, SpO2 97 %. Body mass index is 40.73 kg/m. General: Well developed, well nourished, in no acute distress. Head eyes ears nose throat: Normocephalic, atraumatic, sclera non-icteric, no xanthomas, nares are without discharge. No apparent thyromegaly and/or mass  Lungs: Normal respiratory effort. Some wheezes, basilar rales, diffuse rhonchi.  Heart: RRR with normal S1 S2. no murmur gallop, no rub, PMI is normal size and placement, carotid upstroke normal without bruit, jugular venous pressure is normal Abdomen: Soft, non-tender, distended with normoactive bowel sounds. No hepatomegaly. No rebound/guarding. No obvious abdominal masses. Abdominal aorta is normal size without bruit Extremities: Trace to 1+ edema. no cyanosis, no clubbing, no ulcers  Peripheral : 2+ bilateral upper extremity pulses, 1+ bilateral femoral pulses, 1+ bilateral dorsal pedal pulse Neuro: Alert and oriented. No facial asymmetry. No focal deficit. Moves all extremities spontaneously. Musculoskeletal: Normal muscle tone without kyphosis Psych:  Responds to questions appropriately with a normal affect.    Assessment: 84 year old female with acute congestive heart failure chronic kidney disease hypertension hyperlipidemia anemia and acute on chronic bronchitis causing hypoxia and malignant hypertension without current evidence of myocardial infarction  Plan: 1. Continue  intravenous Lasix for 2 doses into tomorrow for treatment of chest x-ray congestive heart failure and pleural effusions and further changes based on change in glomerular filtration rate 2. Serial ECG and enzymes to assess for possible myocardial infarction 3. Echocardiogram for LV systolic dysfunction valvular heart disease treating for above issues with medication management 4. Continue nitroglycerin for  malignant hypertension but will be able to taper off later this evening and reinstatement of ACE inhibitor beta-blocker for congestive heart failure 5. Further supportive care and treatment of acute on chronic exacerbation of COPD  Signed, Corey Skains M.D. [facility] 05/11/2020, 7:25 AM

## 2020-05-12 ENCOUNTER — Other Ambulatory Visit: Payer: Self-pay

## 2020-05-12 DIAGNOSIS — N179 Acute kidney failure, unspecified: Secondary | ICD-10-CM

## 2020-05-12 LAB — CBC
HCT: 34.8 % — ABNORMAL LOW (ref 36.0–46.0)
Hemoglobin: 10.4 g/dL — ABNORMAL LOW (ref 12.0–15.0)
MCH: 29 pg (ref 26.0–34.0)
MCHC: 29.9 g/dL — ABNORMAL LOW (ref 30.0–36.0)
MCV: 96.9 fL (ref 80.0–100.0)
Platelets: 220 10*3/uL (ref 150–400)
RBC: 3.59 MIL/uL — ABNORMAL LOW (ref 3.87–5.11)
RDW: 13.7 % (ref 11.5–15.5)
WBC: 7.7 10*3/uL (ref 4.0–10.5)
nRBC: 0 % (ref 0.0–0.2)

## 2020-05-12 LAB — BASIC METABOLIC PANEL
Anion gap: 10 (ref 5–15)
BUN: 54 mg/dL — ABNORMAL HIGH (ref 8–23)
CO2: 36 mmol/L — ABNORMAL HIGH (ref 22–32)
Calcium: 9.4 mg/dL (ref 8.9–10.3)
Chloride: 94 mmol/L — ABNORMAL LOW (ref 98–111)
Creatinine, Ser: 1.95 mg/dL — ABNORMAL HIGH (ref 0.44–1.00)
GFR, Estimated: 25 mL/min — ABNORMAL LOW (ref >60)
Glucose, Bld: 138 mg/dL — ABNORMAL HIGH (ref 70–99)
Potassium: 4 mmol/L (ref 3.5–5.1)
Sodium: 140 mmol/L (ref 135–145)

## 2020-05-12 LAB — GLUCOSE, CAPILLARY
Glucose-Capillary: 173 mg/dL — ABNORMAL HIGH (ref 70–99)
Glucose-Capillary: 100 mg/dL — ABNORMAL HIGH (ref 70–99)
Glucose-Capillary: 278 mg/dL — ABNORMAL HIGH (ref 70–99)
Glucose-Capillary: 234 mg/dL — ABNORMAL HIGH (ref 70–99)
Glucose-Capillary: 255 mg/dL — ABNORMAL HIGH (ref 70–99)

## 2020-05-12 MED ORDER — INSULIN DETEMIR 100 UNIT/ML ~~LOC~~ SOLN
10.0000 [IU] | Freq: Every day | SUBCUTANEOUS | Status: DC
  Administered 2020-05-12 – 2020-05-13 (×2): 10 [IU] via SUBCUTANEOUS
  Filled 2020-05-12 (×3): qty 0.1

## 2020-05-12 MED ORDER — FUROSEMIDE 10 MG/ML IJ SOLN
60.0000 mg | Freq: Every day | INTRAMUSCULAR | Status: DC
  Administered 2020-05-12 – 2020-05-14 (×3): 60 mg via INTRAVENOUS
  Filled 2020-05-12 (×3): qty 6

## 2020-05-12 MED ORDER — SODIUM CHLORIDE 0.9% FLUSH
3.0000 mL | Freq: Two times a day (BID) | INTRAVENOUS | Status: DC
  Administered 2020-05-12 – 2020-05-14 (×5): 3 mL via INTRAVENOUS

## 2020-05-12 MED ORDER — SODIUM CHLORIDE 0.9 % IV SOLN
INTRAVENOUS | Status: DC | PRN
  Administered 2020-05-13 – 2020-05-14 (×2): 500 mL via INTRAVENOUS

## 2020-05-12 NOTE — Progress Notes (Signed)
  Heart Failure Nurse Navigator Note  HFpEF 60-65%, mild left atrial enlargement.  She presented with increasing SOB,cough, congestion and weakness.  Co morbidities:  Diabetes type 2 Hypertension Osteoarthritis Hyperlipidemia CKD stage 4 COPD  Medications:  Lipitor 5 mg daily Lasix 60 mg IV daily   Labs:  Sodium 140, potassium 4.0,BUN 54, creatinine 1.95 (1.9), BNP 780.  Intake 0 Output 1400 ml  Weight 104.8 kg  BMI 40.9  Assessment:  General she is awake and alert,   HEENT- pupils equal, normocephalic  Cardiac- heart tones are regular.  Chest -  Abdomen- rounded soft  Musculoskeletal- trace lower extremity edema  Psych-appropriate and pleasant.  Neuro- speech is clear   Spoke with patient and husband who is at the bedside.  They have grandson and his girlfriend living with them that takes care of the home and the cooking.  They do eat at restaurants once in a while and try to eat healthy.  She has a scale, but does not weigh daily, talked about doing it daily and recording.  She has blood pressure cuff and checks blood pressure twice a week.  Given heart failure teaching booklet and zone magnet. Instructed that would follow up tomorrow.   Pricilla Riffle RN, [facility]

## 2020-05-12 NOTE — Progress Notes (Signed)
PROGRESS NOTE    Pamela Cuevas  VQQ:[id number] DOB: [birth year] DOA: 05/10/2020 PCP: Leonides Sake, MD   Brief Narrative: Taken from H&P Pamela Cuevas  is a 84 y.o. Caucasian female with a known history of type 2 diabetes mellitus, hypertension, osteoarthritis and recent pericarditis for which she was hospitalized at [facility] few days ago, who presented to the emergency room with acute onset of worsening dyspnea with associated cough but has been mainly dry as well as wheezing over the last couple of days no significant deterioration today.  She denied any fever or chills.  She denied any more chest pain.  No nausea or vomiting or abdominal pain.  No dysuria, oliguria or hematuria or flank pain.  On presentation she was tachycardic, tachypneic, hypoxic and hypertensive.    Initial ABG with pH of 7.26, PCO2 of 95 and PO2 of 17 on room air.  BUN of 36 and creatinine of 1.3 with no recent lab records.  COVID-19 and influenza labs negative.  Chest x-ray with cardiomegaly, pulmonary edema and small pleural effusions.  She received 80 mg of IV Lasix, started on IV nitroglycerin drip and BiPAP.  Subjective: Patient was more alert and oriented when seen today.  Husband was at bedside.  Diuresing well.  She is feeling much better. Per nursing concern she refused BiPAP overnight.  Assessment & Plan:   Active Problems:   Acute CHF (congestive heart failure) (HCC)   Acute respiratory failure with hypoxia and hypercapnia (HCC)   Respiratory distress  Acute on chronic hypoxic and hypercarbic respiratory failure/COPD exacerbation/CHF exacerbation.  On presentation she was hypoxic and hypercapnic requiring BiPAP.   According to her husband she uses 2 L of oxygen at home.  Does not follow-up with any cardiologist or pulmonologist and have not seen her primary care physician for a long time.  Not very compliant with oxygen use but he try to keep her on as much as possible.  Quit smoking many  years ago.  Recently seen at [facility] with diagnosis of pericarditis.  No chest pain. Echocardiogram done yesterday with normal EF, undetermined diastolic function and less than 50% IVC collapsibility.  Troponin remain negative. Cardiology was consulted-appreciate their recommendations. Saturating in low 90s on 5 L today. She was initially started on ceftriaxone and Zithromax. Discontinue ceftriaxone as procalcitonin is negative. -Continue with Zithromax. -Continue with Solu-Medrol and DuoNeb. -Continue with IV Lasix. -Strict intake and output. -Daily weight and BMP. -Continue with BiPAP at night to avoid worsening of hypercapnia.  Hypertensive urgency.  Initially requiring nitroglycerin infusion, which was weaned off with improvement in her blood pressure.  She is not on any antihypertensives at home.  Blood pressure within goal today. -Monitor and if started going up we can start her on losartan. -Continue with as needed IV labetalol.  AKI/Elevated creatinine.  No baseline, most likely some element of CKD as patient has uncontrolled diabetes.  Creatinine stable around 1.95, mild increase from 1.90.  Renal ultrasound with increased echogenicity consistent with medical renal disease. -Continue to monitor. -Avoid nephrotoxins  Type 2 diabetes mellitus.  No A1c in chart, CBG elevated on admission and started improving with insulin now..  Patient was on Actos at home.  A1c of 7.6 -Continue with sliding scale. -She should be on SGLT2 like Farxiga or Jardiance instead of Actos due to CHF.  Dyslipidemia. -Continue home dose of statin.  Recent pericarditis/gout.  Patient was on colchicine, husband not sure how long she is taking it may  be started during recent hospital visit where she was diagnosed with pericarditis.  No more chest pain and echocardiogram without any pericardial effusion or sign of inflammation. -Continue colchicine for now.  Objective: Vitals:   05/12/20 0747  05/12/20 0803 05/12/20 1138 05/12/20 1153  BP:  (!) 128/59  134/64  Pulse:  88  81  Resp:  17  17  Temp:  97.6 F (36.4 C)  97.8 F (36.6 C)  TempSrc:  Oral  Oral  SpO2: 92% 90% 93% 90%  Weight:      Height:        Intake/Output Summary (Last 24 hours) at 05/12/2020 1455 Last data filed at 05/12/2020 1025 Gross per 24 hour  Intake 240 ml  Output 1050 ml  Net -810 ml   Filed Weights   05/10/20 2154 05/12/20 0349  Weight: 104.3 kg 104.8 kg    Examination:  General.  Well-developed, obese elderly lady, in no acute distress. Pulmonary.  Some scattered wheeze bilaterally, normal respiratory effort. CV.  Regular rate and rhythm, no JVD, rub or murmur. Abdomen.  Soft, nontender, nondistended, BS positive. CNS.  Alert and oriented x3.  No focal neurologic deficit. Extremities.  Trace LE edema, no cyanosis, pulses intact and symmetrical. Psychiatry.  Judgment and insight appears normal.  DVT prophylaxis: Lovenox Code Status: Full Family Communication: Discussed with husband at bed site and daughter on phone Disposition Plan:  Status is: Inpatient  Remains inpatient appropriate because:Inpatient level of care appropriate due to severity of illness   Dispo: The patient is from: Home              Anticipated d/c is to: Home              Anticipated d/c date is: 2 days              Patient currently is not medically stable to d/c.   Consultants:   Cardiology  Procedures:  Antimicrobials:  Zithromax  Data Reviewed: I have personally reviewed following labs and imaging studies  CBC: Recent Labs  Lab 05/11/20 0435 05/12/20 0607  WBC 5.7 7.7  HGB 9.9* 10.4*  HCT 34.0* 34.8*  MCV 99.4 96.9  PLT 213 793   Basic Metabolic Panel: Recent Labs  Lab 05/10/20 2141 05/10/20 2147 05/11/20 0041 05/11/20 0435 05/12/20 0607  NA 140  --   --  139 140  K 4.2  --   --  4.5 4.0  CL 94*  --   --  96* 94*  CO2 38*  --   --  34* 36*  GLUCOSE 251*  --   --  248* 138*    BUN 36*  --   --  38* 54*  CREATININE 1.83*  --  1.96* 1.90* 1.95*  CALCIUM 9.2  --   --  8.9 9.4  MG  --  2.4  --   --   --    GFR: Estimated Creatinine Clearance: 25.8 mL/min (A) (by C-G formula based on SCr of 1.95 mg/dL (H)). Liver Function Tests: Recent Labs  Lab 05/10/20 2141  AST 30  ALT 28  ALKPHOS 122  BILITOT 0.7  PROT 8.2*  ALBUMIN 3.5   No results for input(s): LIPASE, AMYLASE in the last 168 hours. No results for input(s): AMMONIA in the last 168 hours. Coagulation Profile: No results for input(s): INR, PROTIME in the last 168 hours. Cardiac Enzymes: No results for input(s): CKTOTAL, CKMB, CKMBINDEX, TROPONINI in the last 168 hours. BNP (last  3 results) No results for input(s): PROBNP in the last 8760 hours. HbA1C: Recent Labs    05/11/20 0435  HGBA1C 7.6*   CBG: Recent Labs  Lab 05/11/20 1938 05/11/20 2351 05/12/20 0340 05/12/20 0803 05/12/20 1154  GLUCAP 245* 157* 173* 100* 278*   Lipid Profile: No results for input(s): CHOL, HDL, LDLCALC, TRIG, CHOLHDL, LDLDIRECT in the last 72 hours. Thyroid Function Tests: No results for input(s): TSH, T4TOTAL, FREET4, T3FREE, THYROIDAB in the last 72 hours. Anemia Panel: No results for input(s): VITAMINB12, FOLATE, FERRITIN, TIBC, IRON, RETICCTPCT in the last 72 hours. Sepsis Labs: Recent Labs  Lab 05/10/20 2141 05/11/20 0041  PROCALCITON  --  <0.10  LATICACIDVEN 0.8 1.3    Recent Results (from the past 240 hour(s))  Respiratory Panel by RT PCR (Flu A&B, Covid) - Nasopharyngeal Swab     Status: None   Collection Time: 05/10/20  9:41 PM   Specimen: Nasopharyngeal Swab  Result Value Ref Range Status   SARS Coronavirus 2 by RT PCR NEGATIVE NEGATIVE Final    Comment: (NOTE) SARS-CoV-2 target nucleic acids are NOT DETECTED.  The SARS-CoV-2 RNA is generally detectable in upper respiratoy specimens during the acute phase of infection. The lowest concentration of SARS-CoV-2 viral copies this assay can  detect is 131 copies/mL. A negative result does not preclude SARS-Cov-2 infection and should not be used as the sole basis for treatment or other patient management decisions. A negative result may occur with  improper specimen collection/handling, submission of specimen other than nasopharyngeal swab, presence of viral mutation(s) within the areas targeted by this assay, and inadequate number of viral copies (<131 copies/mL). A negative result must be combined with clinical observations, patient history, and epidemiological information. The expected result is Negative.  Fact Sheet for Patients:  PinkCheek.be  Fact Sheet for Healthcare Providers:  GravelBags.it  This test is no t yet approved or cleared by the [place] FDA and  has been authorized for detection and/or diagnosis of SARS-CoV-2 by FDA under an Emergency Use Authorization (EUA). This EUA will remain  in effect (meaning this test can be used) for the duration of the COVID-19 declaration under Section 564(b)(1) of the Act, 21 U.S.C. section 360bbb-3(b)(1), unless the authorization is terminated or revoked sooner.     Influenza A by PCR NEGATIVE NEGATIVE Final   Influenza B by PCR NEGATIVE NEGATIVE Final    Comment: (NOTE) The Xpert Xpress SARS-CoV-2/FLU/RSV assay is intended as an aid in  the diagnosis of influenza from Nasopharyngeal swab specimens and  should not be used as a sole basis for treatment. Nasal washings and  aspirates are unacceptable for Xpert Xpress SARS-CoV-2/FLU/RSV  testing.  Fact Sheet for Patients: PinkCheek.be  Fact Sheet for Healthcare Providers: GravelBags.it  This test is not yet approved or cleared by the [place] FDA and  has been authorized for detection and/or diagnosis of SARS-CoV-2 by  FDA under an Emergency Use Authorization (EUA). This EUA will remain  in  effect (meaning this test can be used) for the duration of the  Covid-19 declaration under Section 564(b)(1) of the Act, 21  U.S.C. section 360bbb-3(b)(1), unless the authorization is  terminated or revoked. Performed at [facility], [address], [place], [place] [address]      Radiology Studies: US RENAL  Result Date: 05/11/2020 CLINICAL DATA:  Acute kidney injury. EXAM: RENAL / URINARY TRACT ULTRASOUND COMPLETE COMPARISON:  None. FINDINGS: Right Kidney: Renal measurements: 9.4 x 4.5 x 4.4 cm = volume:  97 mL. Increased echogenicity of renal parenchyma is noted suggesting medical renal disease. 1.7 cm simple cyst is noted in upper pole. No mass or hydronephrosis visualized. Left Kidney: Renal measurements: 8.5 x 5.0 x 5.0 cm = volume: 112 mL. Increased echogenicity of renal parenchyma is noted suggesting medical renal disease. No mass or hydronephrosis visualized. Bladder: Appears normal for degree of bladder distention. Other: None. IMPRESSION: Increased echogenicity of renal parenchyma is noted bilaterally suggesting medical renal disease. Mild left renal atrophy is noted. No hydronephrosis or renal obstruction is noted. Electronically Signed   By: Marijo Conception M.D.   On: 05/11/2020 15:26   DG Chest Portable 1 View  Result Date: 05/10/2020 CLINICAL DATA:  Respiratory distress EXAM: PORTABLE CHEST 1 VIEW COMPARISON:  05/05/2020 FINDINGS: Moderate cardiomegaly with small pleural effusions and mild pulmonary edema. IMPRESSION: Cardiomegaly with small pleural effusions and mild pulmonary edema. Electronically Signed   By: Ulyses Jarred M.D.   On: 05/10/2020 23:01   ECHOCARDIOGRAM COMPLETE  Result Date: 05/11/2020    ECHOCARDIOGRAM REPORT   Patient Name:   Pamela Cuevas Date of Exam: 05/11/2020 Medical Rec #:  [id number]          Height:       63.0 in Accession #:    [id number]         Weight:       229.9 lb Date of Birth:  09-Jan-1936         BSA:          2.052 m Patient  Age:    89 years           BP:           131/55 mmHg Patient Gender: F                  HR:           100 bpm. Exam Location:  [facility] Procedure: 2D Echo, Color Doppler, Cardiac Doppler and Intracardiac            Opacification Agent Indications:     I50.31 CHF-Acute Diastolic  History:         Patient has no prior history of Echocardiogram examinations.                  Risk Factors:Hypertension and Diabetes.  Sonographer:     Charmayne Sheer RDCS (AE) Referring Phys:  [id number] [place] Diagnosing Phys: Kathlyn Sacramento MD  Sonographer Comments: Technically difficult study due to poor echo windows. TDS due to usage of BIPAP and Image acquisition challenging due to patient body habitus. IMPRESSIONS  1. Left ventricular ejection fraction, by estimation, is 60 to 65%. The left ventricle has normal function. The left ventricle has no regional wall motion abnormalities. Left ventricular diastolic function could not be evaluated.  2. Right ventricular systolic function is normal. The right ventricular size is normal. Tricuspid regurgitation signal is inadequate for assessing PA pressure.  3. Left atrial size was mildly dilated.  4. The mitral valve is normal in structure. No evidence of mitral valve regurgitation. No evidence of mitral stenosis.  5. The aortic valve is normal in structure. Aortic valve regurgitation is not visualized. No aortic stenosis is present.  6. The inferior vena cava is dilated in size with <50% respiratory variability, suggesting right atrial pressure of 15 mmHg. FINDINGS  Left Ventricle: Left ventricular ejection fraction, by estimation, is 60 to 65%. The left ventricle has normal function. The left ventricle  has no regional wall motion abnormalities. Definity contrast agent was given IV to delineate the left ventricular  endocardial borders. The left ventricular internal cavity size was normal in size. There is no left ventricular hypertrophy. Left ventricular diastolic function could not be  evaluated. Right Ventricle: The right ventricular size is normal. No increase in right ventricular wall thickness. Right ventricular systolic function is normal. Tricuspid regurgitation signal is inadequate for assessing PA pressure. Left Atrium: Left atrial size was mildly dilated. Right Atrium: Right atrial size was normal in size. Pericardium: There is no evidence of pericardial effusion. Mitral Valve: The mitral valve is normal in structure. Mild mitral annular calcification. No evidence of mitral valve regurgitation. No evidence of mitral valve stenosis. MV peak gradient, 4.4 mmHg. The mean mitral valve gradient is 3.0 mmHg. Tricuspid Valve: The tricuspid valve is normal in structure. Tricuspid valve regurgitation is not demonstrated. No evidence of tricuspid stenosis. Aortic Valve: The aortic valve is normal in structure. Aortic valve regurgitation is not visualized. No aortic stenosis is present. Aortic valve mean gradient measures 4.0 mmHg. Aortic valve peak gradient measures 8.9 mmHg. Aortic valve area, by VTI measures 2.45 cm. Pulmonic Valve: The pulmonic valve was normal in structure. Pulmonic valve regurgitation is not visualized. No evidence of pulmonic stenosis. Aorta: The aortic root is normal in size and structure. Venous: The inferior vena cava is dilated in size with less than 50% respiratory variability, suggesting right atrial pressure of 15 mmHg. IAS/Shunts: No atrial level shunt detected by color flow Doppler. Additional Comments: There is a small pleural effusion in the left lateral region.  LEFT VENTRICLE PLAX 2D LVIDd:         4.51 cm  Diastology LVIDs:         2.98 cm  LV e' medial:    6.64 cm/s LV PW:         0.90 cm  LV E/e' medial:  10.8 LV IVS:        0.82 cm  LV e' lateral:   9.03 cm/s LVOT diam:     2.00 cm  LV E/e' lateral: 7.9 LV SV:         52 LV SV Index:   25 LVOT Area:     3.14 cm  LEFT ATRIUM           Index LA diam:      4.00 cm 1.95 cm/m LA Vol (A4C): 31.2 ml 15.21 ml/m   AORTIC VALVE                   PULMONIC VALVE AV Area (Vmax):    2.10 cm    PV Vmax:       1.06 m/s AV Area (Vmean):   2.57 cm    PV Vmean:      57.900 cm/s AV Area (VTI):     2.45 cm    PV VTI:        0.112 m AV Vmax:           149.00 cm/s PV Peak grad:  4.5 mmHg AV Vmean:          89.300 cm/s PV Mean grad:  2.0 mmHg AV VTI:            0.213 m AV Peak Grad:      8.9 mmHg AV Mean Grad:      4.0 mmHg LVOT Vmax:         99.80 cm/s LVOT Vmean:  73.100 cm/s LVOT VTI:          0.166 m LVOT/AV VTI ratio: 0.78  AORTA Ao Root diam: 2.80 cm MITRAL VALVE MV Area (PHT): 6.17 cm    SHUNTS MV Peak grad:  4.4 mmHg    Systemic VTI:  0.17 m MV Mean grad:  3.0 mmHg    Systemic Diam: 2.00 cm MV Vmax:       1.05 m/s MV Vmean:      76.6 cm/s MV Decel Time: 123 msec MV E velocity: 71.60 cm/s MV A velocity: 81.40 cm/s MV E/A ratio:  0.88 Kathlyn Sacramento MD Electronically signed by Kathlyn Sacramento MD Signature Date/Time: 05/11/2020/11:48:04 AM    Final     Scheduled Meds: . albuterol  10 mg/hr Nebulization Once  . atorvastatin  5 mg Oral Daily  . colchicine  0.6 mg Oral QODAY  . enoxaparin (LOVENOX) injection  40 mg Subcutaneous Q24H  . furosemide  60 mg Intravenous Daily  . guaiFENesin  600 mg Oral BID  . insulin aspart  0-15 Units Subcutaneous Q4H  . insulin detemir  10 Units Subcutaneous QHS  . ipratropium  0.5 mg Nebulization Once  . ipratropium-albuterol  3 mL Nebulization QID  . predniSONE  40 mg Oral Q breakfast  . Vitamin D (Ergocalciferol)  50,000 Units Oral Q Tue   Continuous Infusions: . cefTRIAXone (ROCEPHIN)  IV 2 g (05/11/20 2310)  . nitroGLYCERIN Stopped (05/11/20 1225)     LOS: 2 days   Time spent: 30 minutes.  Lorella Nimrod, MD [facility]  If 7PM-7AM, please contact night-coverage [email or web address]  05/12/2020, 2:55 PM   This record has been created using Systems analyst. Errors have been sought and corrected,but may not always be located. Such creation errors  do not reflect on the standard of care.

## 2020-05-12 NOTE — Progress Notes (Signed)
[facility] Encounter Note  Patient: Pamela Cuevas / Admit Date: 05/10/2020 / Date of Encounter: 05/12/2020, 1:14 PM   Subjective: Patient rest and improvement of shortness of breath cough and congestion.  Oxygenation improved.  Patient still does have some rhonchi and wheezing consistent with exacerbation of COPD.  Additional improvements have occurred with furosemide injection reducing diastolic dysfunction heart failure and pulmonary edema.  Currently no evidence of worsening kidney dysfunction with a glomerular filtration rate of 25.  Anemia still stable with a hemoglobin of 10.4.  No evidence of acute coronary syndrome with peak troponin of 10. Echocardiogram showing normal LV systolic function with ejection fraction of 60% and no evidence of significant valvular heart disease contributing to about  Review of Systems: Positive for: Shortness of breath Negative for: Vision change, hearing change, syncope, dizziness, nausea, vomiting,diarrhea, bloody stool, stomach pain, cough, congestion, diaphoresis, urinary frequency, urinary pain,skin lesions, skin rashes Others previously listed  Objective: Telemetry: Normal sinus rhythm Physical Exam: Blood pressure 134/64, pulse 81, temperature 97.8 F (36.6 C), temperature source Oral, resp. rate 17, height 5\' 3"  (1.6 m), weight 104.8 kg, SpO2 90 %. Body mass index is 40.94 kg/m. General: Well developed, well nourished, in no acute distress. Head: Normocephalic, atraumatic, sclera non-icteric, no xanthomas, nares are without discharge. Neck: No apparent masses Lungs: Normal respirations with some wheezes, some rhonchi, no rales , some basilar crackles   Heart: Regular rate and rhythm, normal S1 S2, no murmur, no rub, no gallop, PMI is normal size and placement, carotid upstroke normal without bruit, jugular venous pressure normal Abdomen: Soft, non-tender, non-distended with normoactive bowel sounds. No  hepatosplenomegaly. Abdominal aorta is normal size without bruit Extremities: Trace to 1+ edema, no clubbing, no cyanosis, no ulcers,  Peripheral: 2+ radial, 2+ femoral, 2+ dorsal pedal pulses Neuro: Alert and oriented. Moves all extremities spontaneously. Psych:  Responds to questions appropriately with a normal affect.   Intake/Output Summary (Last 24 hours) at 05/12/2020 1314 Last data filed at 05/12/2020 1025 Gross per 24 hour  Intake 240 ml  Output 1050 ml  Net -810 ml    Inpatient Medications:  . albuterol  10 mg/hr Nebulization Once  . atorvastatin  5 mg Oral Daily  . colchicine  0.6 mg Oral QODAY  . enoxaparin (LOVENOX) injection  40 mg Subcutaneous Q24H  . furosemide  60 mg Intravenous Daily  . guaiFENesin  600 mg Oral BID  . insulin aspart  0-15 Units Subcutaneous Q4H  . insulin detemir  10 Units Subcutaneous QHS  . ipratropium  0.5 mg Nebulization Once  . ipratropium-albuterol  3 mL Nebulization QID  . predniSONE  40 mg Oral Q breakfast  . Vitamin D (Ergocalciferol)  50,000 Units Oral Q Tue   Infusions:  . cefTRIAXone (ROCEPHIN)  IV 2 g (05/11/20 2310)  . nitroGLYCERIN Stopped (05/11/20 1225)    Labs: Recent Labs    05/10/20 2147 05/11/20 0041 05/11/20 0435 05/12/20 0607  NA  --    < > 139 140  K  --    < > 4.5 4.0  CL  --    < > 96* 94*  CO2  --    < > 34* 36*  GLUCOSE  --    < > 248* 138*  BUN  --    < > 38* 54*  CREATININE  --    < > 1.90* 1.95*  CALCIUM  --    < > 8.9 9.4  MG 2.4  --   --   --    < > =  values in this interval not displayed.   Recent Labs    05/10/20 2141  AST 30  ALT 28  ALKPHOS 122  BILITOT 0.7  PROT 8.2*  ALBUMIN 3.5   Recent Labs    2020-05-17 0435 05/12/20 0607  WBC 5.7 7.7  HGB 9.9* 10.4*  HCT 34.0* 34.8*  MCV 99.4 96.9  PLT 213 220   No results for input(s): CKTOTAL, CKMB, TROPONINI in the last 72 hours. Invalid input(s): POCBNP Recent Labs    2020/05/17 0435  HGBA1C 7.6*     Weights: Filed Weights    05/10/20 2154 05/12/20 0349  Weight: 104.3 kg 104.8 kg     Radiology/Studies:  US RENAL  Result Date: 2020-05-17 CLINICAL DATA:  Acute kidney injury. EXAM: RENAL / URINARY TRACT ULTRASOUND COMPLETE COMPARISON:  None. FINDINGS: Right Kidney: Renal measurements: 9.4 x 4.5 x 4.4 cm = volume: 97 mL. Increased echogenicity of renal parenchyma is noted suggesting medical renal disease. 1.7 cm simple cyst is noted in upper pole. No mass or hydronephrosis visualized. Left Kidney: Renal measurements: 8.5 x 5.0 x 5.0 cm = volume: 112 mL. Increased echogenicity of renal parenchyma is noted suggesting medical renal disease. No mass or hydronephrosis visualized. Bladder: Appears normal for degree of bladder distention. Other: None. IMPRESSION: Increased echogenicity of renal parenchyma is noted bilaterally suggesting medical renal disease. Mild left renal atrophy is noted. No hydronephrosis or renal obstruction is noted. Electronically Signed   By: Marijo Conception M.D.   On: 05/17/2020 15:26   DG Chest Portable 1 View  Result Date: 05/10/2020 CLINICAL DATA:  Respiratory distress EXAM: PORTABLE CHEST 1 VIEW COMPARISON:  05/05/2020 FINDINGS: Moderate cardiomegaly with small pleural effusions and mild pulmonary edema. IMPRESSION: Cardiomegaly with small pleural effusions and mild pulmonary edema. Electronically Signed   By: Ulyses Jarred M.D.   On: 05/10/2020 23:01   ECHOCARDIOGRAM COMPLETE  Result Date: 05-17-20    ECHOCARDIOGRAM REPORT   Patient Name:   Pamela Cuevas Mah Date of Exam: May 17, 2020 Medical Rec #:  [id number]          Height:       63.0 in Accession #:    [id number]         Weight:       229.9 lb Date of Birth:  12/24/1935         BSA:          2.052 m Patient Age:    84 years           BP:           131/55 mmHg Patient Gender: F                  HR:           100 bpm. Exam Location:  [facility] Procedure: 2D Echo, Color Doppler, Cardiac Doppler and Intracardiac            Opacification Agent  Indications:     I50.31 CHF-Acute Diastolic  History:         Patient has no prior history of Echocardiogram examinations.                  Risk Factors:Hypertension and Diabetes.  Sonographer:     Charmayne Sheer RDCS (AE) Referring Phys:  [id number] [place] Diagnosing Phys: Kathlyn Sacramento MD  Sonographer Comments: Technically difficult study due to poor echo windows. TDS due to usage of BIPAP and Image acquisition challenging due to patient body  habitus. IMPRESSIONS  1. Left ventricular ejection fraction, by estimation, is 60 to 65%. The left ventricle has normal function. The left ventricle has no regional wall motion abnormalities. Left ventricular diastolic function could not be evaluated.  2. Right ventricular systolic function is normal. The right ventricular size is normal. Tricuspid regurgitation signal is inadequate for assessing PA pressure.  3. Left atrial size was mildly dilated.  4. The mitral valve is normal in structure. No evidence of mitral valve regurgitation. No evidence of mitral stenosis.  5. The aortic valve is normal in structure. Aortic valve regurgitation is not visualized. No aortic stenosis is present.  6. The inferior vena cava is dilated in size with <50% respiratory variability, suggesting right atrial pressure of 15 mmHg. FINDINGS  Left Ventricle: Left ventricular ejection fraction, by estimation, is 60 to 65%. The left ventricle has normal function. The left ventricle has no regional wall motion abnormalities. Definity contrast agent was given IV to delineate the left ventricular  endocardial borders. The left ventricular internal cavity size was normal in size. There is no left ventricular hypertrophy. Left ventricular diastolic function could not be evaluated. Right Ventricle: The right ventricular size is normal. No increase in right ventricular wall thickness. Right ventricular systolic function is normal. Tricuspid regurgitation signal is inadequate for assessing PA pressure. Left  Atrium: Left atrial size was mildly dilated. Right Atrium: Right atrial size was normal in size. Pericardium: There is no evidence of pericardial effusion. Mitral Valve: The mitral valve is normal in structure. Mild mitral annular calcification. No evidence of mitral valve regurgitation. No evidence of mitral valve stenosis. MV peak gradient, 4.4 mmHg. The mean mitral valve gradient is 3.0 mmHg. Tricuspid Valve: The tricuspid valve is normal in structure. Tricuspid valve regurgitation is not demonstrated. No evidence of tricuspid stenosis. Aortic Valve: The aortic valve is normal in structure. Aortic valve regurgitation is not visualized. No aortic stenosis is present. Aortic valve mean gradient measures 4.0 mmHg. Aortic valve peak gradient measures 8.9 mmHg. Aortic valve area, by VTI measures 2.45 cm. Pulmonic Valve: The pulmonic valve was normal in structure. Pulmonic valve regurgitation is not visualized. No evidence of pulmonic stenosis. Aorta: The aortic root is normal in size and structure. Venous: The inferior vena cava is dilated in size with less than 50% respiratory variability, suggesting right atrial pressure of 15 mmHg. IAS/Shunts: No atrial level shunt detected by color flow Doppler. Additional Comments: There is a small pleural effusion in the left lateral region.  LEFT VENTRICLE PLAX 2D LVIDd:         4.51 cm  Diastology LVIDs:         2.98 cm  LV e' medial:    6.64 cm/s LV PW:         0.90 cm  LV E/e' medial:  10.8 LV IVS:        0.82 cm  LV e' lateral:   9.03 cm/s LVOT diam:     2.00 cm  LV E/e' lateral: 7.9 LV SV:         52 LV SV Index:   25 LVOT Area:     3.14 cm  LEFT ATRIUM           Index LA diam:      4.00 cm 1.95 cm/m LA Vol (A4C): 31.2 ml 15.21 ml/m  AORTIC VALVE                   PULMONIC VALVE AV Area (Vmax):  2.10 cm    PV Vmax:       1.06 m/s AV Area (Vmean):   2.57 cm    PV Vmean:      57.900 cm/s AV Area (VTI):     2.45 cm    PV VTI:        0.112 m AV Vmax:           149.00  cm/s PV Peak grad:  4.5 mmHg AV Vmean:          89.300 cm/s PV Mean grad:  2.0 mmHg AV VTI:            0.213 m AV Peak Grad:      8.9 mmHg AV Mean Grad:      4.0 mmHg LVOT Vmax:         99.80 cm/s LVOT Vmean:        73.100 cm/s LVOT VTI:          0.166 m LVOT/AV VTI ratio: 0.78  AORTA Ao Root diam: 2.80 cm MITRAL VALVE MV Area (PHT): 6.17 cm    SHUNTS MV Peak grad:  4.4 mmHg    Systemic VTI:  0.17 m MV Mean grad:  3.0 mmHg    Systemic Diam: 2.00 cm MV Vmax:       1.05 m/s MV Vmean:      76.6 cm/s MV Decel Time: 123 msec MV E velocity: 71.60 cm/s MV A velocity: 81.40 cm/s MV E/A ratio:  0.88 Kathlyn Sacramento MD Electronically signed by Kathlyn Sacramento MD Signature Date/Time: 05/11/2020/11:48:04 AM    Final      Assessment and Recommendation  84 y.o. female with acute exacerbation of COPD and bronchitis causing hypoxia and acute diastolic dysfunction congestive heart failure with pulmonary edema without evidence of current concerns of acute coronary syndrome 1.  Continue supportive care for acute COPD exacerbation and bronchitis with antibiotics and inhalers and oxygen 2.  Continue intravenous Lasix for acute diastolic dysfunction congestive heart failure 24 more hours.  Will continue to watch for kidney injury but likely will discontinue intravenous and go to oral Lasix tomorrow 3.  No further cardiac diagnostics necessary at this time due to patient's improvements overnight and echocardiogram showing normal LV systolic function and no current evidence of acute coronary syndrome 4.  Begin ambulation and follow for improvements  Signed, Serafina Royals M.D. FACC

## 2020-05-12 NOTE — Progress Notes (Signed)
Mobility Specialist - Progress Note   05/12/20 1522  Mobility  Activity Transferred:  Bed to chair  Level of Assistance Contact guard assist, steadying assist  Assistive Device Front wheel walker  Distance Ambulated (ft) 5 ft  Mobility Response Tolerated well  Mobility performed by Mobility specialist  $Mobility charge 1 Mobility    Pt laying in bed upon arrival. Pt agreed to session. Pt transitioned supine to sit EOB mod. Independently. Sitting at EOB, O2 desat to 84%. Pt was asymptomatic. No c/o SOB. Pt was on 5L O2 [place]. Increased O2 to 7L O2 [place]. O2 sat up to high 80s. Pt S2S CGA. Pt transferred to chair w/ CGA using a RW. No LOB noted. No c/o pain or SOB. O2 trending in the high 80s. Pt left sitting on recliner, utilizing 7L O2 [place]. Call bell and phone placed in reach. Nurse was notified.    Pamela Cuevas Mobility Specialist  05/12/20, 3:28 PM

## 2020-05-13 ENCOUNTER — Encounter: Payer: Self-pay | Admitting: Family Medicine

## 2020-05-13 ENCOUNTER — Inpatient Hospital Stay: Payer: Medicare Other

## 2020-05-13 LAB — BASIC METABOLIC PANEL
Anion gap: 10 (ref 5–15)
BUN: 61 mg/dL — ABNORMAL HIGH (ref 8–23)
CO2: 38 mmol/L — ABNORMAL HIGH (ref 22–32)
Calcium: 9.4 mg/dL (ref 8.9–10.3)
Chloride: 92 mmol/L — ABNORMAL LOW (ref 98–111)
Creatinine, Ser: 1.64 mg/dL — ABNORMAL HIGH (ref 0.44–1.00)
GFR, Estimated: 31 mL/min — ABNORMAL LOW (ref >60)
Glucose, Bld: 135 mg/dL — ABNORMAL HIGH (ref 70–99)
Potassium: 3.9 mmol/L (ref 3.5–5.1)
Sodium: 140 mmol/L (ref 135–145)

## 2020-05-13 LAB — GLUCOSE, CAPILLARY
Glucose-Capillary: 122 mg/dL — ABNORMAL HIGH (ref 70–99)
Glucose-Capillary: 132 mg/dL — ABNORMAL HIGH (ref 70–99)
Glucose-Capillary: 167 mg/dL — ABNORMAL HIGH (ref 70–99)
Glucose-Capillary: 194 mg/dL — ABNORMAL HIGH (ref 70–99)
Glucose-Capillary: 254 mg/dL — ABNORMAL HIGH (ref 70–99)

## 2020-05-13 MED ORDER — SODIUM CHLORIDE 0.9% FLUSH: 3.0000 mL | INTRAVENOUS | Status: DC | PRN

## 2020-05-13 NOTE — Progress Notes (Addendum)
Progress Note    Pamela Cuevas  JOA:[id number] DOB: [birth year]  DOA: 05/10/2020 PCP: Leonides Sake, MD      Brief Narrative:    Medical records reviewed and are as summarized below:  Pamela Cuevas is a 84 y.o. female       Assessment/Plan:   Active Problems:   Acute CHF (congestive heart failure) (HCC)   Acute respiratory failure with hypoxia and hypercapnia (HCC)   Respiratory distress   AKI (acute kidney injury) ([place])   Body mass index is 40.76 kg/m.  (Morbid obesity)  COPD exacerbation: Continue IV antibiotics, prednisone and bronchodilators.  Acute on chronic diastolic CHF: Continue IV Lasix.  Monitor daily weight, urine output and BMP.  Acute on chronic hypoxemic and hypercapnic respiratory failure: Improved.  She is back to her baseline oxygen, 2 L/min.  Type II DM: Continue Levemir and Humalog.  CKD stage IIIb: Creatinine is stable.  Continue to monitor.  Recent pericarditis, gout: Continue colchicine  Generalized weakness: Consult PT  Plan discussed with her daughter, Pamela Cuevas.  She requested that I asked Dr. Ubaldo Glassing, cardiologist, to see the patient instead of Dr. Nehemiah Massed, because Dr. Ubaldo Glassing sees her dad as an outpatient.  Dr. Ubaldo Glassing was notified and he recommended Dr. Nehemiah Massed continue to see the patient as an inpatient for continuity and that he will see the patient as an outpatient.   Diet Order            Diet heart healthy/carb modified Room service appropriate? Yes; Fluid consistency: Thin  Diet effective now                    Consultants:  Cardiologist  Procedures:  None    Medications:   . albuterol  10 mg/hr Nebulization Once  . atorvastatin  5 mg Oral Daily  . colchicine  0.6 mg Oral QODAY  . enoxaparin (LOVENOX) injection  40 mg Subcutaneous Q24H  . furosemide  60 mg Intravenous Daily  . guaiFENesin  600 mg Oral BID  . insulin aspart  0-15 Units Subcutaneous Q4H  . insulin detemir  10 Units Subcutaneous  QHS  . ipratropium  0.5 mg Nebulization Once  . ipratropium-albuterol  3 mL Nebulization QID  . predniSONE  40 mg Oral Q breakfast  . sodium chloride flush  3 mL Intravenous Q12H  . Vitamin D (Ergocalciferol)  50,000 Units Oral Q Tue   Continuous Infusions: . sodium chloride Stopped (05/13/20 0206)  . cefTRIAXone (ROCEPHIN)  IV Stopped (05/13/20 0101)  . nitroGLYCERIN Stopped (05/11/20 1224)     Anti-infectives (From admission, onward)   Start     Dose/Rate Route Frequency Ordered Stop   05/11/20 0000  cefTRIAXone (ROCEPHIN) 2 g in sodium chloride 0.9 % 100 mL IVPB        2 g 200 mL/hr over 30 Minutes Intravenous Every 24 hours 05/10/20 2359 05/15/20 2359             Family Communication/Anticipated D/C date and plan/Code Status   DVT prophylaxis: enoxaparin (LOVENOX) injection 40 mg Start: 05/12/20 1000     Code Status: Full Code  Family Communication: Plan discussed with her daughter, Pamela Cuevas, over the phone Disposition Plan:    Status is: Inpatient  Remains inpatient appropriate because:Inpatient level of care appropriate due to severity of illness   Dispo: The patient is from: Home              Anticipated d/c is to: Home  Anticipated d/c date is: 1 day              Patient currently is not medically stable to d/c.           Subjective:   C/o generalized weakness.  No shortness of breath or chest pain.  Objective:    Vitals:   05/12/20 2006 05/13/20 0438 05/13/20 0850 05/13/20 1300  BP:  139/63 (!) 126/58 136/61  Pulse:  73 84 91  Resp:   19   Temp:  99.8 F (37.7 C) (!) 97.5 F (36.4 C) 97.8 F (36.6 C)  TempSrc:  Oral Oral Oral  SpO2: 92% 95% 91% 91%  Weight:  104.4 kg    Height:       No data found.   Intake/Output Summary (Last 24 hours) at 05/13/2020 1605 Last data filed at 05/13/2020 1527 Gross per 24 hour  Intake 896.16 ml  Output 1450 ml  Net -553.84 ml   Filed Weights   05/10/20 2154 05/12/20 0349  05/13/20 0438  Weight: 104.3 kg 104.8 kg 104.4 kg    Exam:  GEN: NAD SKIN: No rash EYES: EOMI ENT: MMM CV: RRR PULM: CTA B ABD: soft, obese, NT, +BS CNS: AAO x 3, non focal EXT: No edema or tenderness   Data Reviewed:   I have personally reviewed following labs and imaging studies:  Labs: Labs show the following:   Basic Metabolic Panel: Recent Labs  Lab 05/10/20 2141 05/10/20 2141 05/10/20 2147 05/11/20 0041 05/11/20 0435 05/11/20 0435 05/12/20 0607 05/13/20 0634  NA 140  --   --   --  139  --  140 140  K 4.2   < >  --   --  4.5   < > 4.0 3.9  CL 94*  --   --   --  96*  --  94* 92*  CO2 38*  --   --   --  34*  --  36* 38*  GLUCOSE 251*  --   --   --  248*  --  138* 135*  BUN 36*  --   --   --  38*  --  54* 61*  CREATININE 1.83*  --   --  1.96* 1.90*  --  1.95* 1.64*  CALCIUM 9.2  --   --   --  8.9  --  9.4 9.4  MG  --   --  2.4  --   --   --   --   --    < > = values in this interval not displayed.   GFR Estimated Creatinine Clearance: 30.6 mL/min (A) (by C-G formula based on SCr of 1.64 mg/dL (H)). Liver Function Tests: Recent Labs  Lab 05/10/20 2141  AST 30  ALT 28  ALKPHOS 122  BILITOT 0.7  PROT 8.2*  ALBUMIN 3.5   No results for input(s): LIPASE, AMYLASE in the last 168 hours. No results for input(s): AMMONIA in the last 168 hours. Coagulation profile No results for input(s): INR, PROTIME in the last 168 hours.  CBC: Recent Labs  Lab 05/11/20 0435 05/12/20 0607  WBC 5.7 7.7  HGB 9.9* 10.4*  HCT 34.0* 34.8*  MCV 99.4 96.9  PLT 213 220   Cardiac Enzymes: No results for input(s): CKTOTAL, CKMB, CKMBINDEX, TROPONINI in the last 168 hours. BNP (last 3 results) No results for input(s): PROBNP in the last 8760 hours. CBG: Recent Labs  Lab 05/12/20 2107 05/13/20 0036 05/13/20 0515 05/13/20  5035 05/13/20 1238  GLUCAP 255* 167* 132* 122* 194*   D-Dimer: No results for input(s): DDIMER in the last 72 hours. Hgb A1c: Recent Labs     05/11/20 0435  HGBA1C 7.6*   Lipid Profile: No results for input(s): CHOL, HDL, LDLCALC, TRIG, CHOLHDL, LDLDIRECT in the last 72 hours. Thyroid function studies: No results for input(s): TSH, T4TOTAL, T3FREE, THYROIDAB in the last 72 hours.  Invalid input(s): FREET3 Anemia work up: No results for input(s): VITAMINB12, FOLATE, FERRITIN, TIBC, IRON, RETICCTPCT in the last 72 hours. Sepsis Labs: Recent Labs  Lab 05/10/20 2141 05/11/20 0041 05/11/20 0435 05/12/20 4656  PROCALCITON  --  <0.10  --   --   WBC  --   --  5.7 7.7  LATICACIDVEN 0.8 1.3  --   --     Microbiology Recent Results (from the past 240 hour(s))  Respiratory Panel by RT PCR (Flu A&B, Covid) - Nasopharyngeal Swab     Status: None   Collection Time: 05/10/20  9:41 PM   Specimen: Nasopharyngeal Swab  Result Value Ref Range Status   SARS Coronavirus 2 by RT PCR NEGATIVE NEGATIVE Final    Comment: (NOTE) SARS-CoV-2 target nucleic acids are NOT DETECTED.  The SARS-CoV-2 RNA is generally detectable in upper respiratoy specimens during the acute phase of infection. The lowest concentration of SARS-CoV-2 viral copies this assay can detect is 131 copies/mL. A negative result does not preclude SARS-Cov-2 infection and should not be used as the sole basis for treatment or other patient management decisions. A negative result may occur with  improper specimen collection/handling, submission of specimen other than nasopharyngeal swab, presence of viral mutation(s) within the areas targeted by this assay, and inadequate number of viral copies (<131 copies/mL). A negative result must be combined with clinical observations, patient history, and epidemiological information. The expected result is Negative.  Fact Sheet for Patients:  PinkCheek.be  Fact Sheet for Healthcare Providers:  GravelBags.it  This test is no t yet approved or cleared by the [place]  FDA and  has been authorized for detection and/or diagnosis of SARS-CoV-2 by FDA under an Emergency Use Authorization (EUA). This EUA will remain  in effect (meaning this test can be used) for the duration of the COVID-19 declaration under Section 564(b)(1) of the Act, 21 U.S.C. section 360bbb-3(b)(1), unless the authorization is terminated or revoked sooner.     Influenza A by PCR NEGATIVE NEGATIVE Final   Influenza B by PCR NEGATIVE NEGATIVE Final    Comment: (NOTE) The Xpert Xpress SARS-CoV-2/FLU/RSV assay is intended as an aid in  the diagnosis of influenza from Nasopharyngeal swab specimens and  should not be used as a sole basis for treatment. Nasal washings and  aspirates are unacceptable for Xpert Xpress SARS-CoV-2/FLU/RSV  testing.  Fact Sheet for Patients: PinkCheek.be  Fact Sheet for Healthcare Providers: GravelBags.it  This test is not yet approved or cleared by the [place] FDA and  has been authorized for detection and/or diagnosis of SARS-CoV-2 by  FDA under an Emergency Use Authorization (EUA). This EUA will remain  in effect (meaning this test can be used) for the duration of the  Covid-19 declaration under Section 564(b)(1) of the Act, 21  U.S.C. section 360bbb-3(b)(1), unless the authorization is  terminated or revoked. Performed at [facility], [address] [place], [place] [address]     Procedures and diagnostic studies:  DG Chest 2 View  Result Date: 05/13/2020 CLINICAL DATA:  Shortness of breath,  chest pain. EXAM: CHEST - 2 VIEW COMPARISON:  May 10, 2020. FINDINGS: Stable cardiomegaly. No pneumothorax or pleural effusion is noted. Bilateral pleural effusions are noted, left greater than right. Bibasilar atelectasis or infiltrates are noted. Bony thorax is unremarkable. IMPRESSION: Bilateral pleural effusions, left greater than right. Bibasilar atelectasis or infiltrates are  noted. Electronically Signed   By: Marijo Conception M.D.   On: 05/13/2020 09:51               LOS: 3 days   Cainan Trull  [facility]   Pager on [email or web address]. If 7PM-7AM, please contact night-coverage at [email or web address]     05/13/2020, 4:05 PM

## 2020-05-13 NOTE — TOC Initial Note (Signed)
Transition of Care [facility]) - Initial/Assessment Note    Patient Details  Name: Pamela Cuevas MRN: [id number] Date of Birth: [birth year]  Transition of Care [facility]) CM/SW Contact:    Meriel Flavors, LCSW Phone Number: 05/13/2020, 6:07 PM  Clinical Narrative:                 Patient here from home with family, plan is to return home at discharge. Patient states she doesn't have any needs at this time, she has familial support. It was discussed in progression for PT/OT to eval patient as she will likely need HH/PT when discharged. CSW will follow up with patient when PT/OT evals are completed.        Patient Goals and CMS Choice        Expected Discharge Plan and Services                                                Prior Living Arrangements/Services                       Activities of Daily Living Home Assistive Devices/Equipment: Gilford Rile (specify type) ADL Screening (condition at time of admission) Patient's cognitive ability adequate to safely complete daily activities?: Yes Is the patient deaf or have difficulty hearing?: No Does the patient have difficulty seeing, even when wearing glasses/contacts?: No Does the patient have difficulty concentrating, remembering, or making decisions?: No Patient able to express need for assistance with ADLs?: Yes Does the patient have difficulty dressing or bathing?: No Independently performs ADLs?: Yes (appropriate for developmental age) Does the patient have difficulty walking or climbing stairs?: No Weakness of Legs: None Weakness of Arms/Hands: None  Permission Sought/Granted                  Emotional Assessment              Admission diagnosis:  Respiratory distress [R06.03] Acute CHF (congestive heart failure) ([place]) [I50.9] Acute respiratory failure with hypoxia and hypercapnia ([place]) [J96.01, J96.02] Patient Active Problem List   Diagnosis Date Noted  . AKI (acute kidney injury) ([place])    . Acute respiratory failure with hypoxia and hypercapnia ([facility])   . Respiratory distress   . Acute CHF (congestive heart failure) ([place]) 05/10/2020   PCP:  Leonides Sake, MD Pharmacy:   [facility] [place], [address] [place] [place] [address] Phone: [phone] Fax: [phone]     Social Determinants of Health (SDOH) Interventions    Readmission Risk Interventions No flowsheet data found.

## 2020-05-13 NOTE — Progress Notes (Signed)
  Heart Failure Nurse Navigator Note  HFpEF 60-65%, mild left atrial enlargement.  She presented with increasing cough, SOB, congestion and weakness.  Co morbidities:  Diabetes type 2 Hypertension Osteoarthritis Hyperlipidemia CKD stage 4 COPD   Medications:  Lipitor 5 mg daily Lasix 60 mg IV daily  Labs:  Sodium 140, potassium 3.9, BUN 61 (54), creatinine 1.64 (1.95)    CXR- bilateral pleural effusions, left > right.  Intake 480 ml Output 2100 ml  Assessment:  General- she is awake and alert, siting up in chair at the bedside, husband is present.  HEENT-pupils are equal, normocephalic  Cardiac- heart tones are regular  Chest- breath sounds diminished in the bases.  No wheezes noted.  Abdomen- soft rounded  Musculoskeletal- no lower extremity edema.  Neuro- speech is clear.  Psych- is pleasant and appropriate.    She states she is just a little bit from feeling normal.  Did not sleep well last evening with the BiPAP- she is not used to sleeping with it.  Discussed fluid restriction and keeping track.   Pricilla Riffle RN, CHFN

## 2020-05-13 NOTE — Progress Notes (Signed)
Mobility Specialist - Progress Note   05/13/20 1328  Mobility  Activity Transferred to/from [facility];Transferred:  Bed to chair (** BSC to chair)  Level of Assistance Standby assist, set-up cues, supervision of patient - no hands on (CGA for safety)  Assistive Device Front wheel walker  Distance Ambulated (ft) 10 ft  Mobility Response Tolerated well  Mobility performed by Mobility specialist  $Mobility charge 1 Mobility    Pt long-sitting in bed w/ nurse present in room upon arrival. Pt agreed to session. Pt able to get to EOB mod. Independently. O2 desat to 80% while on 2L O2 [place]. O2 increased to 3L O2 [place], O2 sat up to mid-80s. Pt S2S CGA. Pt transferred from bed to [facility] w/ RW SBA. Pt had a BM. Mobility specialist provided assistance for hygiene. Pt ambulated from [facility] to chair w/ RW SBA. No LOB noted. No c/o pain or SOB. Pt remained on 3L O2 [place]. O2 trending mid-high 80s the rest of the session. Overall, pt tolerated session well. Pt left sitting on recliner w/ all needs placed in reach. Nurse was notified.    Cillian Gwinner Mobility Specialist  05/13/20, 1:35 PM

## 2020-05-13 NOTE — Progress Notes (Signed)
[facility] Encounter Note  Patient: Pamela Cuevas / Admit Date: 05/10/2020 / Date of Encounter: 05/13/2020, 8:29 AM   Subjective: Patient has had significant improvements of shortness of breath cough and congestion over the last 24 to 48 hours.  No current evidence of concerns of significant heart failure at this time.  Patient has had stable glomerular filtration rate at 31 and hemoglobin of 10.1 port the patient has not had any acute myocardial infarction with a peak troponin of 10.  The patient has had what appears to be acute on chronic respiratory infection which has slow improvements.  Echocardiogram showing normal LV systolic function with ejection fraction of 60% and no evidence of significant valvular heart disease contributing to about  Review of Systems: Positive for: Shortness of breath Negative for: Vision change, hearing change, syncope, dizziness, nausea, vomiting,diarrhea, bloody stool, stomach pain, cough, congestion, diaphoresis, urinary frequency, urinary pain,skin lesions, skin rashes Others previously listed  Objective: Telemetry: Normal sinus rhythm Physical Exam: Blood pressure 139/63, pulse 73, temperature 99.8 F (37.7 C), temperature source Oral, resp. rate 17, height 5\' 3"  (1.6 m), weight 104.4 kg, SpO2 95 %. Body mass index is 40.76 kg/m. General: Well developed, well nourished, in no acute distress. Head: Normocephalic, atraumatic, sclera non-icteric, no xanthomas, nares are without discharge. Neck: No apparent masses Lungs: Normal respirations with some wheezes, some rhonchi, no rales , some basilar crackles   Heart: Regular rate and rhythm, normal S1 S2, no murmur, no rub, no gallop, PMI is normal size and placement, carotid upstroke normal without bruit, jugular venous pressure normal Abdomen: Soft, non-tender, non-distended with normoactive bowel sounds. No hepatosplenomegaly. Abdominal aorta is normal size without  bruit Extremities: Trace to 1+ edema, no clubbing, no cyanosis, no ulcers,  Peripheral: 2+ radial, 2+ femoral, 2+ dorsal pedal pulses Neuro: Alert and oriented. Moves all extremities spontaneously. Psych:  Responds to questions appropriately with a normal affect.   Intake/Output Summary (Last 24 hours) at 05/13/2020 0829 Last data filed at 05/13/2020 0439 Gross per 24 hour  Intake 896.16 ml  Output 2100 ml  Net -1203.84 ml    Inpatient Medications:  . albuterol  10 mg/hr Nebulization Once  . atorvastatin  5 mg Oral Daily  . colchicine  0.6 mg Oral QODAY  . enoxaparin (LOVENOX) injection  40 mg Subcutaneous Q24H  . furosemide  60 mg Intravenous Daily  . guaiFENesin  600 mg Oral BID  . insulin aspart  0-15 Units Subcutaneous Q4H  . insulin detemir  10 Units Subcutaneous QHS  . ipratropium  0.5 mg Nebulization Once  . ipratropium-albuterol  3 mL Nebulization QID  . predniSONE  40 mg Oral Q breakfast  . sodium chloride flush  3 mL Intravenous Q12H  . Vitamin D (Ergocalciferol)  50,000 Units Oral Q Tue   Infusions:  . sodium chloride Stopped (05/13/20 0206)  . cefTRIAXone (ROCEPHIN)  IV Stopped (05/13/20 0101)  . nitroGLYCERIN Stopped (05/11/20 1224)    Labs: Recent Labs    05/10/20 2147 05/11/20 0041 05/12/20 5852 05/13/20 0634  NA  --    < > 140 140  K  --    < > 4.0 3.9  CL  --    < > 94* 92*  CO2  --    < > 36* 38*  GLUCOSE  --    < > 138* 135*  BUN  --    < > 54* 61*  CREATININE  --    < > 1.95*  1.64*  CALCIUM  --    < > 9.4 9.4  MG 2.4  --   --   --    < > = values in this interval not displayed.   Recent Labs    05/10/20 2141  AST 30  ALT 28  ALKPHOS 122  BILITOT 0.7  PROT 8.2*  ALBUMIN 3.5   Recent Labs    18-May-2020 0435 05/12/20 0607  WBC 5.7 7.7  HGB 9.9* 10.4*  HCT 34.0* 34.8*  MCV 99.4 96.9  PLT 213 220   No results for input(s): CKTOTAL, CKMB, TROPONINI in the last 72 hours. Invalid input(s): POCBNP Recent Labs    05/27/2020 0435   HGBA1C 7.6*     Weights: Filed Weights   05/10/20 2154 05/12/20 0349 05/13/20 0438  Weight: 104.3 kg 104.8 kg 104.4 kg     Radiology/Studies:  US RENAL  Result Date: 05/27/2020 CLINICAL DATA:  Acute kidney injury. EXAM: RENAL / URINARY TRACT ULTRASOUND COMPLETE COMPARISON:  None. FINDINGS: Right Kidney: Renal measurements: 9.4 x 4.5 x 4.4 cm = volume: 97 mL. Increased echogenicity of renal parenchyma is noted suggesting medical renal disease. 1.7 cm simple cyst is noted in upper pole. No mass or hydronephrosis visualized. Left Kidney: Renal measurements: 8.5 x 5.0 x 5.0 cm = volume: 112 mL. Increased echogenicity of renal parenchyma is noted suggesting medical renal disease. No mass or hydronephrosis visualized. Bladder: Appears normal for degree of bladder distention. Other: None. IMPRESSION: Increased echogenicity of renal parenchyma is noted bilaterally suggesting medical renal disease. Mild left renal atrophy is noted. No hydronephrosis or renal obstruction is noted. Electronically Signed   By: Marijo Conception M.D.   On: May 18, 2020 15:26   DG Chest Portable 1 View  Result Date: 05/10/2020 CLINICAL DATA:  Respiratory distress EXAM: PORTABLE CHEST 1 VIEW COMPARISON:  05/05/2020 FINDINGS: Moderate cardiomegaly with small pleural effusions and mild pulmonary edema. IMPRESSION: Cardiomegaly with small pleural effusions and mild pulmonary edema. Electronically Signed   By: Ulyses Jarred M.D.   On: 05/10/2020 23:01   ECHOCARDIOGRAM COMPLETE  Result Date: 06/10/2020    ECHOCARDIOGRAM REPORT   Patient Name:   Pamela Cuevas Date of Exam: 06/09/2020 Medical Rec #:  [id number]          Height:       63.0 in Accession #:    [id number]         Weight:       229.9 lb Date of Birth:  10/04/1935         BSA:          2.052 m Patient Age:    84 years           BP:           131/55 mmHg Patient Gender: F                  HR:           100 bpm. Exam Location:  [facility] Procedure: 2D Echo, Color Doppler,  Cardiac Doppler and Intracardiac            Opacification Agent Indications:     I50.31 CHF-Acute Diastolic  History:         Patient has no prior history of Echocardiogram examinations.                  Risk Factors:Hypertension and Diabetes.  Sonographer:     Charmayne Sheer RDCS (AE) Referring Phys:  [phone]  JAN A MANSY Diagnosing Phys: Kathlyn Sacramento MD  Sonographer Comments: Technically difficult study due to poor echo windows. TDS due to usage of BIPAP and Image acquisition challenging due to patient body habitus. IMPRESSIONS  1. Left ventricular ejection fraction, by estimation, is 60 to 65%. The left ventricle has normal function. The left ventricle has no regional wall motion abnormalities. Left ventricular diastolic function could not be evaluated.  2. Right ventricular systolic function is normal. The right ventricular size is normal. Tricuspid regurgitation signal is inadequate for assessing PA pressure.  3. Left atrial size was mildly dilated.  4. The mitral valve is normal in structure. No evidence of mitral valve regurgitation. No evidence of mitral stenosis.  5. The aortic valve is normal in structure. Aortic valve regurgitation is not visualized. No aortic stenosis is present.  6. The inferior vena cava is dilated in size with <50% respiratory variability, suggesting right atrial pressure of 15 mmHg. FINDINGS  Left Ventricle: Left ventricular ejection fraction, by estimation, is 60 to 65%. The left ventricle has normal function. The left ventricle has no regional wall motion abnormalities. Definity contrast agent was given IV to delineate the left ventricular  endocardial borders. The left ventricular internal cavity size was normal in size. There is no left ventricular hypertrophy. Left ventricular diastolic function could not be evaluated. Right Ventricle: The right ventricular size is normal. No increase in right ventricular wall thickness. Right ventricular systolic function is normal. Tricuspid  regurgitation signal is inadequate for assessing PA pressure. Left Atrium: Left atrial size was mildly dilated. Right Atrium: Right atrial size was normal in size. Pericardium: There is no evidence of pericardial effusion. Mitral Valve: The mitral valve is normal in structure. Mild mitral annular calcification. No evidence of mitral valve regurgitation. No evidence of mitral valve stenosis. MV peak gradient, 4.4 mmHg. The mean mitral valve gradient is 3.0 mmHg. Tricuspid Valve: The tricuspid valve is normal in structure. Tricuspid valve regurgitation is not demonstrated. No evidence of tricuspid stenosis. Aortic Valve: The aortic valve is normal in structure. Aortic valve regurgitation is not visualized. No aortic stenosis is present. Aortic valve mean gradient measures 4.0 mmHg. Aortic valve peak gradient measures 8.9 mmHg. Aortic valve area, by VTI measures 2.45 cm. Pulmonic Valve: The pulmonic valve was normal in structure. Pulmonic valve regurgitation is not visualized. No evidence of pulmonic stenosis. Aorta: The aortic root is normal in size and structure. Venous: The inferior vena cava is dilated in size with less than 50% respiratory variability, suggesting right atrial pressure of 15 mmHg. IAS/Shunts: No atrial level shunt detected by color flow Doppler. Additional Comments: There is a small pleural effusion in the left lateral region.  LEFT VENTRICLE PLAX 2D LVIDd:         4.51 cm  Diastology LVIDs:         2.98 cm  LV e' medial:    6.64 cm/s LV PW:         0.90 cm  LV E/e' medial:  10.8 LV IVS:        0.82 cm  LV e' lateral:   9.03 cm/s LVOT diam:     2.00 cm  LV E/e' lateral: 7.9 LV SV:         52 LV SV Index:   25 LVOT Area:     3.14 cm  LEFT ATRIUM           Index LA diam:      4.00 cm 1.95 cm/m LA Vol (  A4C): 31.2 ml 15.21 ml/m  AORTIC VALVE                   PULMONIC VALVE AV Area (Vmax):    2.10 cm    PV Vmax:       1.06 m/s AV Area (Vmean):   2.57 cm    PV Vmean:      57.900 cm/s AV Area  (VTI):     2.45 cm    PV VTI:        0.112 m AV Vmax:           149.00 cm/s PV Peak grad:  4.5 mmHg AV Vmean:          89.300 cm/s PV Mean grad:  2.0 mmHg AV VTI:            0.213 m AV Peak Grad:      8.9 mmHg AV Mean Grad:      4.0 mmHg LVOT Vmax:         99.80 cm/s LVOT Vmean:        73.100 cm/s LVOT VTI:          0.166 m LVOT/AV VTI ratio: 0.78  AORTA Ao Root diam: 2.80 cm MITRAL VALVE MV Area (PHT): 6.17 cm    SHUNTS MV Peak grad:  4.4 mmHg    Systemic VTI:  0.17 m MV Mean grad:  3.0 mmHg    Systemic Diam: 2.00 cm MV Vmax:       1.05 m/s MV Vmean:      76.6 cm/s MV Decel Time: 123 msec MV E velocity: 71.60 cm/s MV A velocity: 81.40 cm/s MV E/A ratio:  0.88 Kathlyn Sacramento MD Electronically signed by Kathlyn Sacramento MD Signature Date/Time: 05/11/2020/11:48:04 AM    Final      Assessment and Recommendation  85 y.o. female with acute exacerbation of COPD and bronchitis causing hypoxia and acute diastolic dysfunction congestive heart failure with pulmonary edema without evidence of current concerns of acute coronary syndrome 1.  Continue supportive care for acute COPD exacerbation and bronchitis with antibiotics and inhalers and oxygen 2.  Will perform chest x-ray today for further evaluation of improvements of symptoms as well as improvements of pulmonary edema and further additional changes in Lasix.  If patient has clearing of pulmonary edema would change Lasix to oral 3.  No further cardiac diagnostics necessary at this time due to patient's improvements overnight and echocardiogram showing normal LV systolic function and no current evidence of acute coronary syndrome 4.  Begin ambulation and follow for improvements  Signed, Serafina Royals M.D. FACC

## 2020-05-13 NOTE — Care Management Important Message (Signed)
Important Message  Patient Details  Name: Pamela Cuevas MRN: [id number] Date of Birth: 1936/07/03   Medicare Important Message Given:  Yes     [name] 05/13/2020, 11:14 AM

## 2020-05-14 LAB — BASIC METABOLIC PANEL
Anion gap: 11 (ref 5–15)
BUN: 64 mg/dL — ABNORMAL HIGH (ref 8–23)
CO2: 39 mmol/L — ABNORMAL HIGH (ref 22–32)
Calcium: 9 mg/dL (ref 8.9–10.3)
Chloride: 89 mmol/L — ABNORMAL LOW (ref 98–111)
Creatinine, Ser: 1.67 mg/dL — ABNORMAL HIGH (ref 0.44–1.00)
GFR, Estimated: 30 mL/min — ABNORMAL LOW (ref >60)
Glucose, Bld: 101 mg/dL — ABNORMAL HIGH (ref 70–99)
Potassium: 4.2 mmol/L (ref 3.5–5.1)
Sodium: 139 mmol/L (ref 135–145)

## 2020-05-14 LAB — GLUCOSE, CAPILLARY
Glucose-Capillary: 103 mg/dL — ABNORMAL HIGH (ref 70–99)
Glucose-Capillary: 157 mg/dL — ABNORMAL HIGH (ref 70–99)
Glucose-Capillary: 188 mg/dL — ABNORMAL HIGH (ref 70–99)
Glucose-Capillary: 226 mg/dL — ABNORMAL HIGH (ref 70–99)
Glucose-Capillary: 281 mg/dL — ABNORMAL HIGH (ref 70–99)

## 2020-05-14 MED ORDER — IPRATROPIUM-ALBUTEROL 0.5-2.5 (3) MG/3ML IN SOLN
3.0000 mL | Freq: Three times a day (TID) | RESPIRATORY_TRACT | Status: DC
  Administered 2020-05-14: 3 mL via RESPIRATORY_TRACT
  Filled 2020-05-14: qty 3

## 2020-05-14 MED ORDER — PREDNISONE 20 MG PO TABS: 40.0000 mg | ORAL_TABLET | Freq: Every day | ORAL | 0 refills | Status: AC

## 2020-05-14 MED ORDER — CARVEDILOL 3.125 MG PO TABS: 3.1250 mg | ORAL_TABLET | Freq: Two times a day (BID) | ORAL | Status: DC

## 2020-05-14 MED ORDER — CARVEDILOL 3.125 MG PO TABS: 3.1250 mg | ORAL_TABLET | Freq: Two times a day (BID) | ORAL | 0 refills | Status: AC

## 2020-05-14 NOTE — Progress Notes (Signed)
Mobility Specialist - Progress Note   05/14/20 1407  Mobility  Activity Refused mobility  Mobility performed by Mobility specialist    Pt politely declined mobility session this afternoon d/t feeling tired after PT session. Will hold and re-attempt at a later date/time.    Homero Hyson Mobility Specialist  05/14/20, 2:08 PM

## 2020-05-14 NOTE — Evaluation (Signed)
Physical Therapy Evaluation Patient Details Name: Pamela Cuevas MRN: [id number] DOB: June 21, 1936 Today's Date: 05/14/2020   History of Present Illness  Per MD:  Marcheta Horsey  is a 84 y.o. Caucasian female with a known history of type 2 diabetes mellitus, hypertension, osteoarthritis and recent pericarditis for which she was hospitalized at [facility] few days ago, who presented to the emergency room with acute onset of worsening dyspnea with associated cough but has been mainly dry as well as wheezing over the last couple of days no significant deterioration today.  She denied any fever or chills.  She denied any more chest pain.  No nausea or vomiting or abdominal pain.  No dysuria, oliguria or hematuria or flank pain.     Clinical Impression  Pt received in sitting position at EOB and agreeable to therapy. Pt notes she needs to use the restroom prior to performing PT.  Pt able to transfer to bedside commode with CGA and perform self-care modI.  Pt then able to transfer back to bed where patient performed exercises listed below.  Pt does exhibit decreased strength in BLE and notes that her LLE has had a TKA performed and is still giving her issues.  Pt SpO2 assessed upon arrival to room and found to be 79% on room air.  Pt notes she typically wears 3L at home.  Pt placed on 3L of supplemental O2 with nasal cannula and O2 rose to 89%.  Pt placed on 4L and was able to maintain >90% throughout rest of session.  Pt then transferred to standing and received gait training during ambulation around nursing station.  Pt has decreased step length and requires frequent rest breaks when ambulating around nursing station.  Pt encouraged to remain upright rather than having slouched posture on walker.  Pt also encouraged to take deeper breathes in through nose, rather than short bursts as she demonstrated initially.  Pt has difficulty with proper pursed lip breathing and does not have good carryover with  proper technique.  Pt is stable and able to ambulate around nursing station which is more than what she typically does at home.  Pt transferred and back to recliner where chair alarm was set and all needs were within reach.  Pt will benefit from skilled PT intervention to increase independence and safety with basic mobility in preparation for discharge to the venue listed below.        Follow Up Recommendations Home health PT    Equipment Recommendations  None recommended by PT    Recommendations for Other Services       Precautions / Restrictions Precautions Precautions: None Restrictions Weight Bearing Restrictions: No      Mobility  Bed Mobility               General bed mobility comments: Pt upright at EOB upon arrival to room.    Transfers Overall transfer level: Modified independent Equipment used: Rolling walker (2 wheeled)             General transfer comment: Pt able to stand with use of FWW and transfer to bedside commode.  Ambulation/Gait Ambulation/Gait assistance: Min guard Gait Distance (Feet): 160 Feet Assistive device: Rolling walker (2 wheeled) Gait Pattern/deviations: Step-to pattern;Decreased step length - right;Decreased step length - left;Decreased stride length Gait velocity: decreased   General Gait Details: Pt takes very short steps when ambulating and required 3 standing rest breaks in which pt had slouched posture and propped herself on FWW.  Stairs  Wheelchair Mobility    Modified Rankin (Stroke Patients Only)       Balance Overall balance assessment: Needs assistance Sitting-balance support: Feet supported Sitting balance-Leahy Scale: Good     Standing balance support: Bilateral upper extremity supported Standing balance-Leahy Scale: Fair Standing balance comment: Pt requires UE support for balance.                             Pertinent Vitals/Pain Pain Assessment: No/denies pain    Home  Living Family/patient expects to be discharged to:: Private residence Living Arrangements: Spouse/significant other;Other relatives Available Help at Discharge: Family Type of Home: Mobile home Home Access: Ramped entrance     Home Layout: One level Home Equipment: Walker - 4 wheels      Prior Function Level of Independence: Independent with assistive device(s)         Comments: Pt reports she was able to perform ADLs with use of AD.  Pt self-reports not getting out of house frequently, and husband does grocery shopping.     Hand Dominance        Extremity/Trunk Assessment   Upper Extremity Assessment Upper Extremity Assessment: Overall WFL for tasks assessed    Lower Extremity Assessment Lower Extremity Assessment: Generalized weakness    Cervical / Trunk Assessment Cervical / Trunk Assessment: Normal  Communication   Communication: No difficulties  Cognition Arousal/Alertness: Awake/alert Behavior During Therapy: WFL for tasks assessed/performed Overall Cognitive Status: Within Functional Limits for tasks assessed                                        General Comments      Exercises Total Joint Exercises Ankle Circles/Pumps: AROM;Strengthening;Both;10 reps;Seated Gluteal Sets: AROM;Strengthening;Both;10 reps;Seated Long Arc Quad: AROM;Strengthening;Both;10 reps;Seated Knee Flexion: AROM;Strengthening;Both;10 reps;Seated Marching in Standing: AROM;Strengthening;Both;10 reps;Standing Other Exercises Other Exercises: Pt educated on roles of PT and services providede during hospital stay. Other Exercises: Pt received gait training for proper pattern and increase hip flexion to clear obstacles. Other Exercises: Pt received training on pursed-lip breathing technique for SpO2, and postural training during ambulation for adequate lung function.   Assessment/Plan    PT Assessment Patient needs continued PT services  PT Problem List Decreased  strength;Decreased range of motion;Decreased activity tolerance;Decreased balance;Decreased mobility;Decreased knowledge of use of DME       PT Treatment Interventions DME instruction;Gait training;Stair training;Functional mobility training;Therapeutic activities;Therapeutic exercise;Balance training    PT Goals (Current goals can be found in the Care Plan section)  Acute Rehab PT Goals Patient Stated Goal: Get stronger PT Goal Formulation: With patient Time For Goal Achievement: 05/28/20 Potential to Achieve Goals: Good    Frequency Min 2X/week   Barriers to discharge        Co-evaluation               AM-PAC PT "6 Clicks" Mobility  Outcome Measure Help needed turning from your back to your side while in a flat bed without using bedrails?: A Little Help needed moving from lying on your back to sitting on the side of a flat bed without using bedrails?: A Little Help needed moving to and from a bed to a chair (including a wheelchair)?: A Little Help needed standing up from a chair using your arms (e.g., wheelchair or bedside chair)?: A Little Help needed to walk in hospital room?: A Little  Help needed climbing 3-5 steps with a railing? : A Lot 6 Click Score: 17    End of Session Equipment Utilized During Treatment: Gait belt Activity Tolerance: Patient tolerated treatment well;Patient limited by fatigue Patient left: in chair;with call bell/phone within reach;with chair alarm set Nurse Communication: Mobility status PT Visit Diagnosis: Unsteadiness on feet (R26.81);Other abnormalities of gait and mobility (R26.89);Muscle weakness (generalized) (M62.81);History of falling (Z91.81);Difficulty in walking, not elsewhere classified (R26.2)    Time: 1030-1104 PT Time Calculation (min) (ACUTE ONLY): 34 min   Charges:   PT Evaluation $PT Eval Low Complexity: 1 Low PT Treatments $Gait Training: 8-22 mins $Therapeutic Exercise: 8-22 mins        Gwenlyn Saran, PT,  DPT 05/14/20, 3:22 PM

## 2020-05-14 NOTE — Progress Notes (Addendum)
Discharge instructions explained/pt and husband verbalized understanding. IV and tele removed. Awaiting grandson to bring home oxygen tank, and will then transport off unit via wheelchair when oxygen arrives.

## 2020-05-14 NOTE — Progress Notes (Signed)
[facility] Encounter Note  Patient: Pamela Cuevas / Admit Date: 05/10/2020 / Date of Encounter: 05/14/2020, 1:38 PM   Subjective: Patient has had significant improvements of shortness of breath cough and congestion since admission with much improved breathing status.  No current evidence of concerns of significant heart failure at this time but continued cough and congestion.  Patient has had stable glomerular filtration rate at 30 and hemoglobin of 10.1 port the patient has not had any acute myocardial infarction with a peak troponin of 10.  The patient has had what appears to be acute on chronic respiratory infection which has slow improvements.   Echocardiogram showing normal LV systolic function with ejection fraction of 60% and no evidence of significant valvular heart disease contributing to about Chest x-ray has shown improvements of pulmonary edema but continued mild bilateral pleural effusions and atelectasis Review of Systems: Positive for: Shortness of breath cough  negative for: Vision change, hearing change, syncope, dizziness, nausea, vomiting,diarrhea, bloody stool, stomach pain, cough, congestion, diaphoresis, urinary frequency, urinary pain,skin lesions, skin rashes Others previously listed  Objective: Telemetry: Normal sinus rhythm Physical Exam: Blood pressure 120/74, pulse 96, temperature 98.4 F (36.9 C), temperature source Oral, resp. rate 18, height 5\' 3"  (1.6 m), weight 102.6 kg, SpO2 93 %. Body mass index is 40.09 kg/m. General: Well developed, well nourished, in no acute distress. Head: Normocephalic, atraumatic, sclera non-icteric, no xanthomas, nares are without discharge. Neck: No apparent masses Lungs: Normal respirations with some wheezes, some rhonchi, no rales , some basilar crackles   Heart: Regular rate and rhythm, normal S1 S2, no murmur, no rub, no gallop, PMI is normal size and placement, carotid upstroke normal without bruit,  jugular venous pressure normal Abdomen: Soft, non-tender, non-distended with normoactive bowel sounds. No hepatosplenomegaly. Abdominal aorta is normal size without bruit Extremities: Trace to 1+ edema, no clubbing, no cyanosis, no ulcers,  Peripheral: 2+ radial, 2+ femoral, 2+ dorsal pedal pulses Neuro: Alert and oriented. Moves all extremities spontaneously. Psych:  Responds to questions appropriately with a normal affect.   Intake/Output Summary (Last 24 hours) at 05/14/2020 1338 Last data filed at 05/14/2020 1000 Gross per 24 hour  Intake 290 ml  Output 950 ml  Net -660 ml    Inpatient Medications:  . albuterol  10 mg/hr Nebulization Once  . atorvastatin  5 mg Oral Daily  . colchicine  0.6 mg Oral QODAY  . enoxaparin (LOVENOX) injection  40 mg Subcutaneous Q24H  . furosemide  60 mg Intravenous Daily  . guaiFENesin  600 mg Oral BID  . insulin aspart  0-15 Units Subcutaneous Q4H  . insulin detemir  10 Units Subcutaneous QHS  . ipratropium  0.5 mg Nebulization Once  . ipratropium-albuterol  3 mL Nebulization TID  . predniSONE  40 mg Oral Q breakfast  . sodium chloride flush  3 mL Intravenous Q12H  . Vitamin D (Ergocalciferol)  50,000 Units Oral Q Tue   Infusions:  . sodium chloride 500 mL (05/14/20 0022)  . cefTRIAXone (ROCEPHIN)  IV 2 g (05/14/20 0023)    Labs: Recent Labs    05/13/20 0634 05/14/20 0431  NA 140 139  K 3.9 4.2  CL 92* 89*  CO2 38* 39*  GLUCOSE 135* 101*  BUN 61* 64*  CREATININE 1.64* 1.67*  CALCIUM 9.4 9.0   No results for input(s): AST, ALT, ALKPHOS, BILITOT, PROT, ALBUMIN in the last 72 hours. Recent Labs    05/12/20 0607  WBC 7.7  HGB  10.4*  HCT 34.8*  MCV 96.9  PLT 220   No results for input(s): CKTOTAL, CKMB, TROPONINI in the last 72 hours. Invalid input(s): POCBNP No results for input(s): HGBA1C in the last 72 hours.   Weights: Filed Weights   05/12/20 0349 05/13/20 0438 05/14/20 0500  Weight: 104.8 kg 104.4 kg 102.6 kg      Radiology/Studies:  DG Chest 2 View  Result Date: 05/13/2020 CLINICAL DATA:  Shortness of breath, chest pain. EXAM: CHEST - 2 VIEW COMPARISON:  May 10, 2020. FINDINGS: Stable cardiomegaly. No pneumothorax or pleural effusion is noted. Bilateral pleural effusions are noted, left greater than right. Bibasilar atelectasis or infiltrates are noted. Bony thorax is unremarkable. IMPRESSION: Bilateral pleural effusions, left greater than right. Bibasilar atelectasis or infiltrates are noted. Electronically Signed   By: Marijo Conception M.D.   On: 05/13/2020 09:51   US RENAL  Result Date: 05/11/2020 CLINICAL DATA:  Acute kidney injury. EXAM: RENAL / URINARY TRACT ULTRASOUND COMPLETE COMPARISON:  None. FINDINGS: Right Kidney: Renal measurements: 9.4 x 4.5 x 4.4 cm = volume: 97 mL. Increased echogenicity of renal parenchyma is noted suggesting medical renal disease. 1.7 cm simple cyst is noted in upper pole. No mass or hydronephrosis visualized. Left Kidney: Renal measurements: 8.5 x 5.0 x 5.0 cm = volume: 112 mL. Increased echogenicity of renal parenchyma is noted suggesting medical renal disease. No mass or hydronephrosis visualized. Bladder: Appears normal for degree of bladder distention. Other: None. IMPRESSION: Increased echogenicity of renal parenchyma is noted bilaterally suggesting medical renal disease. Mild left renal atrophy is noted. No hydronephrosis or renal obstruction is noted. Electronically Signed   By: Marijo Conception M.D.   On: 05/11/2020 15:26   DG Chest Portable 1 View  Result Date: 05/10/2020 CLINICAL DATA:  Respiratory distress EXAM: PORTABLE CHEST 1 VIEW COMPARISON:  05/05/2020 FINDINGS: Moderate cardiomegaly with small pleural effusions and mild pulmonary edema. IMPRESSION: Cardiomegaly with small pleural effusions and mild pulmonary edema. Electronically Signed   By: Ulyses Jarred M.D.   On: 05/10/2020 23:01   ECHOCARDIOGRAM COMPLETE  Result Date: 05/11/2020     ECHOCARDIOGRAM REPORT   Patient Name:   NIXIE LAUBE Selk Date of Exam: 05/11/2020 Medical Rec #:  [id number]          Height:       63.0 in Accession #:    [id number]         Weight:       229.9 lb Date of Birth:  1935/11/18         BSA:          2.052 m Patient Age:    85 years           BP:           131/55 mmHg Patient Gender: F                  HR:           100 bpm. Exam Location:  [facility] Procedure: 2D Echo, Color Doppler, Cardiac Doppler and Intracardiac            Opacification Agent Indications:     I50.31 CHF-Acute Diastolic  History:         Patient has no prior history of Echocardiogram examinations.                  Risk Factors:Hypertension and Diabetes.  Sonographer:     Charmayne Sheer RDCS (AE) Referring Phys:  [id number] [place] Diagnosing Phys: Kathlyn Sacramento MD  Sonographer Comments: Technically difficult study due to poor echo windows. TDS due to usage of BIPAP and Image acquisition challenging due to patient body habitus. IMPRESSIONS  1. Left ventricular ejection fraction, by estimation, is 60 to 65%. The left ventricle has normal function. The left ventricle has no regional wall motion abnormalities. Left ventricular diastolic function could not be evaluated.  2. Right ventricular systolic function is normal. The right ventricular size is normal. Tricuspid regurgitation signal is inadequate for assessing PA pressure.  3. Left atrial size was mildly dilated.  4. The mitral valve is normal in structure. No evidence of mitral valve regurgitation. No evidence of mitral stenosis.  5. The aortic valve is normal in structure. Aortic valve regurgitation is not visualized. No aortic stenosis is present.  6. The inferior vena cava is dilated in size with <50% respiratory variability, suggesting right atrial pressure of 15 mmHg. FINDINGS  Left Ventricle: Left ventricular ejection fraction, by estimation, is 60 to 65%. The left ventricle has normal function. The left ventricle has no regional wall motion  abnormalities. Definity contrast agent was given IV to delineate the left ventricular  endocardial borders. The left ventricular internal cavity size was normal in size. There is no left ventricular hypertrophy. Left ventricular diastolic function could not be evaluated. Right Ventricle: The right ventricular size is normal. No increase in right ventricular wall thickness. Right ventricular systolic function is normal. Tricuspid regurgitation signal is inadequate for assessing PA pressure. Left Atrium: Left atrial size was mildly dilated. Right Atrium: Right atrial size was normal in size. Pericardium: There is no evidence of pericardial effusion. Mitral Valve: The mitral valve is normal in structure. Mild mitral annular calcification. No evidence of mitral valve regurgitation. No evidence of mitral valve stenosis. MV peak gradient, 4.4 mmHg. The mean mitral valve gradient is 3.0 mmHg. Tricuspid Valve: The tricuspid valve is normal in structure. Tricuspid valve regurgitation is not demonstrated. No evidence of tricuspid stenosis. Aortic Valve: The aortic valve is normal in structure. Aortic valve regurgitation is not visualized. No aortic stenosis is present. Aortic valve mean gradient measures 4.0 mmHg. Aortic valve peak gradient measures 8.9 mmHg. Aortic valve area, by VTI measures 2.45 cm. Pulmonic Valve: The pulmonic valve was normal in structure. Pulmonic valve regurgitation is not visualized. No evidence of pulmonic stenosis. Aorta: The aortic root is normal in size and structure. Venous: The inferior vena cava is dilated in size with less than 50% respiratory variability, suggesting right atrial pressure of 15 mmHg. IAS/Shunts: No atrial level shunt detected by color flow Doppler. Additional Comments: There is a small pleural effusion in the left lateral region.  LEFT VENTRICLE PLAX 2D LVIDd:         4.51 cm  Diastology LVIDs:         2.98 cm  LV e' medial:    6.64 cm/s LV PW:         0.90 cm  LV E/e'  medial:  10.8 LV IVS:        0.82 cm  LV e' lateral:   9.03 cm/s LVOT diam:     2.00 cm  LV E/e' lateral: 7.9 LV SV:         52 LV SV Index:   25 LVOT Area:     3.14 cm  LEFT ATRIUM           Index LA diam:      4.00 cm 1.95 cm/m  LA Vol (A4C): 31.2 ml 15.21 ml/m  AORTIC VALVE                   PULMONIC VALVE AV Area (Vmax):    2.10 cm    PV Vmax:       1.06 m/s AV Area (Vmean):   2.57 cm    PV Vmean:      57.900 cm/s AV Area (VTI):     2.45 cm    PV VTI:        0.112 m AV Vmax:           149.00 cm/s PV Peak grad:  4.5 mmHg AV Vmean:          89.300 cm/s PV Mean grad:  2.0 mmHg AV VTI:            0.213 m AV Peak Grad:      8.9 mmHg AV Mean Grad:      4.0 mmHg LVOT Vmax:         99.80 cm/s LVOT Vmean:        73.100 cm/s LVOT VTI:          0.166 m LVOT/AV VTI ratio: 0.78  AORTA Ao Root diam: 2.80 cm MITRAL VALVE MV Area (PHT): 6.17 cm    SHUNTS MV Peak grad:  4.4 mmHg    Systemic VTI:  0.17 m MV Mean grad:  3.0 mmHg    Systemic Diam: 2.00 cm MV Vmax:       1.05 m/s MV Vmean:      76.6 cm/s MV Decel Time: 123 msec MV E velocity: 71.60 cm/s MV A velocity: 81.40 cm/s MV E/A ratio:  0.88 Kathlyn Sacramento MD Electronically signed by Kathlyn Sacramento MD Signature Date/Time: 05/11/2020/11:48:04 AM    Final      Assessment and Recommendation  84 y.o. female with acute exacerbation of COPD and bronchitis causing hypoxia and acute diastolic dysfunction congestive heart failure with pulmonary edema without evidence of current concerns of acute coronary syndrome 1.  Continue supportive care for acute COPD exacerbation and bronchitis with antibiotics and inhalers and oxygen 2.  Chest x-ray currently showing improvements of pulmonary edema but will need continued diuretics watching closely for acute kidney injury now relatively stable with a GFR of 30.  Will likely need furosemide 80 mg each day as an outpatient  3.  No further cardiac diagnostics necessary at this time due to patient's improvements overnight and  echocardiogram showing normal LV systolic function and no current evidence of acute coronary syndrome 4.  Begin ambulation and follow for improvements 5.  Addition of low-dose carvedilol and beta-blocker for diastolic dysfunction heart failure and hypertension control today  signed, Serafina Royals M.D. FACC

## 2020-05-14 NOTE — Discharge Summary (Addendum)
Physician Discharge Summary  Pamela Cuevas PYP:[id number] DOB: Sep 07, 1935 DOA: 05/10/2020  PCP: Leonides Sake, MD  Admit date: 05/10/2020 Discharge date: 05/15/2020  Discharge disposition: Home with home health therapy   Recommendations for Outpatient Follow-Up:   Follow-up with PCP in 1 week   Discharge Diagnosis:   Principal Problem:   Acute CHF (congestive heart failure) ([place]) Active Problems:   Acute respiratory failure with hypoxia and hypercapnia (H[facility])   Respiratory distress   AKI (acute kidney injury) ([place])   COPD with acute exacerbation ([place])    Discharge Condition: Stable.  Diet recommendation:  Diet Order            Diet - low sodium heart healthy           Diet Carb Modified                   Code Status: Prior     Hospital Course:   Pamela Cuevas is a 84 y.o. female with medical history significant for hypertension, morbid obesity, hyperlipidemia, COPD, chronic hypoxemic and hypercapnic respiratory failure on 2 L/min oxygen via nasal, chronic diastolic CHF, CKD stage IIIb, recently diagnosed with acute pericarditis.  She presented to the hospital with cough, congestion and shortness of breath.  She was admitted to the hospital for hypertensive emergency, acute hypoxemic and hypercapnic respiratory failure secondary to COPD exacerbation and acute on chronic diastolic CHF.  She was treated with IV Lasix, steroids, bronchodilators and empiric IV antibiotics.  She also had AKI on admission and this improved with diuresis.  She was seen in consultation by the cardiologist.  Her condition has slowly improved.  She is back to her baseline oxygen requirement of 2 to 3 L/min.  She was evaluated by PT who recommended home health therapy.    Medical Consultants:    Cardiologist, Dr. Nehemiah Massed   Discharge Exam:    Vitals:   05/14/20 1137 05/14/20 1405 05/14/20 1459 05/14/20 1541  BP: 120/74   137/66  Pulse: 96 90  89  Resp: 18 (!)  23  19  Temp: 98.4 F (36.9 C)   98.9 F (37.2 C)  TempSrc: Oral   Oral  SpO2: 93% (!) 89% (!) 89% 91%  Weight:      Height:         GEN: NAD SKIN: Warm and dry EYES: EOMI ENT: MMM CV: RRR PULM: CTA B ABD: soft, obese, NT, +BS CNS: AAO x 3, non focal EXT: No edema or tenderness   The results of significant diagnostics from this hospitalization (including imaging, microbiology, ancillary and laboratory) are listed below for reference.     Procedures and Diagnostic Studies:   US RENAL  Result Date: 05/11/2020 CLINICAL DATA:  Acute kidney injury. EXAM: RENAL / URINARY TRACT ULTRASOUND COMPLETE COMPARISON:  None. FINDINGS: Right Kidney: Renal measurements: 9.4 x 4.5 x 4.4 cm = volume: 97 mL. Increased echogenicity of renal parenchyma is noted suggesting medical renal disease. 1.7 cm simple cyst is noted in upper pole. No mass or hydronephrosis visualized. Left Kidney: Renal measurements: 8.5 x 5.0 x 5.0 cm = volume: 112 mL. Increased echogenicity of renal parenchyma is noted suggesting medical renal disease. No mass or hydronephrosis visualized. Bladder: Appears normal for degree of bladder distention. Other: None. IMPRESSION: Increased echogenicity of renal parenchyma is noted bilaterally suggesting medical renal disease. Mild left renal atrophy is noted. No hydronephrosis or renal obstruction is noted. Electronically Signed   By: Sabino Dick  Jr M.D.   On: 05/11/2020 15:26   ECHOCARDIOGRAM COMPLETE  Result Date: 05/11/2020    ECHOCARDIOGRAM REPORT   Patient Name:   Pamela Cuevas Date of Exam: 05/11/2020 Medical Rec #:  [id number]          Height:       63.0 in Accession #:    [id number]         Weight:       229.9 lb Date of Birth:  [birth year]         BSA:          2.052 m Patient Age:    32 years           BP:           131/55 mmHg Patient Gender: F                  HR:           100 bpm. Exam Location:  [facility] Procedure: 2D Echo, Color Doppler, Cardiac Doppler and Intracardiac             Opacification Agent Indications:     I50.31 CHF-Acute Diastolic  History:         Patient has no prior history of Echocardiogram examinations.                  Risk Factors:Hypertension and Diabetes.  Sonographer:     Charmayne Sheer RDCS (AE) Referring Phys:  [id number] [place] Diagnosing Phys: Kathlyn Sacramento MD  Sonographer Comments: Technically difficult study due to poor echo windows. TDS due to usage of BIPAP and Image acquisition challenging due to patient body habitus. IMPRESSIONS  1. Left ventricular ejection fraction, by estimation, is 60 to 65%. The left ventricle has normal function. The left ventricle has no regional wall motion abnormalities. Left ventricular diastolic function could not be evaluated.  2. Right ventricular systolic function is normal. The right ventricular size is normal. Tricuspid regurgitation signal is inadequate for assessing PA pressure.  3. Left atrial size was mildly dilated.  4. The mitral valve is normal in structure. No evidence of mitral valve regurgitation. No evidence of mitral stenosis.  5. The aortic valve is normal in structure. Aortic valve regurgitation is not visualized. No aortic stenosis is present.  6. The inferior vena cava is dilated in size with <50% respiratory variability, suggesting right atrial pressure of 15 mmHg. FINDINGS  Left Ventricle: Left ventricular ejection fraction, by estimation, is 60 to 65%. The left ventricle has normal function. The left ventricle has no regional wall motion abnormalities. Definity contrast agent was given IV to delineate the left ventricular  endocardial borders. The left ventricular internal cavity size was normal in size. There is no left ventricular hypertrophy. Left ventricular diastolic function could not be evaluated. Right Ventricle: The right ventricular size is normal. No increase in right ventricular wall thickness. Right ventricular systolic function is normal. Tricuspid regurgitation signal is inadequate for  assessing PA pressure. Left Atrium: Left atrial size was mildly dilated. Right Atrium: Right atrial size was normal in size. Pericardium: There is no evidence of pericardial effusion. Mitral Valve: The mitral valve is normal in structure. Mild mitral annular calcification. No evidence of mitral valve regurgitation. No evidence of mitral valve stenosis. MV peak gradient, 4.4 mmHg. The mean mitral valve gradient is 3.0 mmHg. Tricuspid Valve: The tricuspid valve is normal in structure. Tricuspid valve regurgitation is not demonstrated. No evidence of tricuspid stenosis. Aortic Valve:  The aortic valve is normal in structure. Aortic valve regurgitation is not visualized. No aortic stenosis is present. Aortic valve mean gradient measures 4.0 mmHg. Aortic valve peak gradient measures 8.9 mmHg. Aortic valve area, by VTI measures 2.45 cm. Pulmonic Valve: The pulmonic valve was normal in structure. Pulmonic valve regurgitation is not visualized. No evidence of pulmonic stenosis. Aorta: The aortic root is normal in size and structure. Venous: The inferior vena cava is dilated in size with less than 50% respiratory variability, suggesting right atrial pressure of 15 mmHg. IAS/Shunts: No atrial level shunt detected by color flow Doppler. Additional Comments: There is a small pleural effusion in the left lateral region.  LEFT VENTRICLE PLAX 2D LVIDd:         4.51 cm  Diastology LVIDs:         2.98 cm  LV e' medial:    6.64 cm/s LV PW:         0.90 cm  LV E/e' medial:  10.8 LV IVS:        0.82 cm  LV e' lateral:   9.03 cm/s LVOT diam:     2.00 cm  LV E/e' lateral: 7.9 LV SV:         52 LV SV Index:   25 LVOT Area:     3.14 cm  LEFT ATRIUM           Index LA diam:      4.00 cm 1.95 cm/m LA Vol (A4C): 31.2 ml 15.21 ml/m  AORTIC VALVE                   PULMONIC VALVE AV Area (Vmax):    2.10 cm    PV Vmax:       1.06 m/s AV Area (Vmean):   2.57 cm    PV Vmean:      57.900 cm/s AV Area (VTI):     2.45 cm    PV VTI:        0.112  m AV Vmax:           149.00 cm/s PV Peak grad:  4.5 mmHg AV Vmean:          89.300 cm/s PV Mean grad:  2.0 mmHg AV VTI:            0.213 m AV Peak Grad:      8.9 mmHg AV Mean Grad:      4.0 mmHg LVOT Vmax:         99.80 cm/s LVOT Vmean:        73.100 cm/s LVOT VTI:          0.166 m LVOT/AV VTI ratio: 0.78  AORTA Ao Root diam: 2.80 cm MITRAL VALVE MV Area (PHT): 6.17 cm    SHUNTS MV Peak grad:  4.4 mmHg    Systemic VTI:  0.17 m MV Mean grad:  3.0 mmHg    Systemic Diam: 2.00 cm MV Vmax:       1.05 m/s MV Vmean:      76.6 cm/s MV Decel Time: 123 msec MV E velocity: 71.60 cm/s MV A velocity: 81.40 cm/s MV E/A ratio:  0.88 Kathlyn Sacramento MD Electronically signed by Kathlyn Sacramento MD Signature Date/Time: 05/11/2020/11:48:04 AM    Final      Labs:   Basic Metabolic Panel: Recent Labs  Lab 05/10/20 2141 05/10/20 2141 05/10/20 2147 05/11/20 0041 05/11/20 1610 05/11/20 0435 05/12/20 9604 05/12/20 5409 05/13/20 0634 05/14/20 0431  NA 140  --   --   --  139  --  140  --  140 139  K 4.2   < >  --   --  4.5   < > 4.0   < > 3.9 4.2  CL 94*  --   --   --  96*  --  94*  --  92* 89*  CO2 38*  --   --   --  34*  --  36*  --  38* 39*  GLUCOSE 251*  --   --   --  248*  --  138*  --  135* 101*  BUN 36*  --   --   --  38*  --  54*  --  61* 64*  CREATININE 1.83*   < >  --  1.96* 1.90*  --  1.95*  --  1.64* 1.67*  CALCIUM 9.2  --   --   --  8.9  --  9.4  --  9.4 9.0  MG  --   --  2.4  --   --   --   --   --   --   --    < > = values in this interval not displayed.   GFR Estimated Creatinine Clearance: 29.7 mL/min (A) (by C-G formula based on SCr of 1.67 mg/dL (H)). Liver Function Tests: Recent Labs  Lab 05/10/20 2141  AST 30  ALT 28  ALKPHOS 122  BILITOT 0.7  PROT 8.2*  ALBUMIN 3.5   No results for input(s): LIPASE, AMYLASE in the last 168 hours. No results for input(s): AMMONIA in the last 168 hours. Coagulation profile No results for input(s): INR, PROTIME in the last 168  hours.  CBC: Recent Labs  Lab 05/11/20 0435 05/12/20 0607  WBC 5.7 7.7  HGB 9.9* 10.4*  HCT 34.0* 34.8*  MCV 99.4 96.9  PLT 213 220   Cardiac Enzymes: No results for input(s): CKTOTAL, CKMB, CKMBINDEX, TROPONINI in the last 168 hours. BNP: Invalid input(s): POCBNP CBG: Recent Labs  Lab 05/13/20 2120 05/14/20 0002 05/14/20 0549 05/14/20 0908 05/14/20 1138  GLUCAP 281* 226* 103* 157* 188*   D-Dimer No results for input(s): DDIMER in the last 72 hours. Hgb A1c No results for input(s): HGBA1C in the last 72 hours. Lipid Profile No results for input(s): CHOL, HDL, LDLCALC, TRIG, CHOLHDL, LDLDIRECT in the last 72 hours. Thyroid function studies No results for input(s): TSH, T4TOTAL, T3FREE, THYROIDAB in the last 72 hours.  Invalid input(s): FREET3 Anemia work up No results for input(s): VITAMINB12, FOLATE, FERRITIN, TIBC, IRON, RETICCTPCT in the last 72 hours. Microbiology Recent Results (from the past 240 hour(s))  Respiratory Panel by RT PCR (Flu A&B, Covid) - Nasopharyngeal Swab     Status: None   Collection Time: 05/10/20  9:41 PM   Specimen: Nasopharyngeal Swab  Result Value Ref Range Status   SARS Coronavirus 2 by RT PCR NEGATIVE NEGATIVE Final    Comment: (NOTE) SARS-CoV-2 target nucleic acids are NOT DETECTED.  The SARS-CoV-2 RNA is generally detectable in upper respiratoy specimens during the acute phase of infection. The lowest concentration of SARS-CoV-2 viral copies this assay can detect is 131 copies/mL. A negative result does not preclude SARS-Cov-2 infection and should not be used as the sole basis for treatment or other patient management decisions. A negative result may occur with  improper specimen collection/handling, submission of specimen other than nasopharyngeal swab, presence of viral mutation(s) within the areas targeted by this assay, and inadequate number of viral copies (<  131 copies/mL). A negative result must be combined with  clinical observations, patient history, and epidemiological information. The expected result is Negative.  Fact Sheet for Patients:  PinkCheek.be  Fact Sheet for Healthcare Providers:  GravelBags.it  This test is no t yet approved or cleared by the [place] FDA and  has been authorized for detection and/or diagnosis of SARS-CoV-2 by FDA under an Emergency Use Authorization (EUA). This EUA will remain  in effect (meaning this test can be used) for the duration of the COVID-19 declaration under Section 564(b)(1) of the Act, 21 U.S.C. section 360bbb-3(b)(1), unless the authorization is terminated or revoked sooner.     Influenza A by PCR NEGATIVE NEGATIVE Final   Influenza B by PCR NEGATIVE NEGATIVE Final    Comment: (NOTE) The Xpert Xpress SARS-CoV-2/FLU/RSV assay is intended as an aid in  the diagnosis of influenza from Nasopharyngeal swab specimens and  should not be used as a sole basis for treatment. Nasal washings and  aspirates are unacceptable for Xpert Xpress SARS-CoV-2/FLU/RSV  testing.  Fact Sheet for Patients: PinkCheek.be  Fact Sheet for Healthcare Providers: GravelBags.it  This test is not yet approved or cleared by the [place] FDA and  has been authorized for detection and/or diagnosis of SARS-CoV-2 by  FDA under an Emergency Use Authorization (EUA). This EUA will remain  in effect (meaning this test can be used) for the duration of the  Covid-19 declaration under Section 564(b)(1) of the Act, 21  U.S.C. section 360bbb-3(b)(1), unless the authorization is  terminated or revoked. Performed at [facility], [address] [place], [place] [address]      Discharge Instructions:   Discharge Instructions    AMB referral to CHF clinic   Complete by: As directed    Diet - low sodium heart healthy   Complete by: As directed     Diet Carb Modified   Complete by: As directed    Face-to-face encounter (required for Medicare/Medicaid patients)   Complete by: As directed    I Mao Lockner certify that this patient is under my care and that I, or a nurse practitioner or physician's assistant working with me, had a face-to-face encounter that meets the physician face-to-face encounter requirements with this patient on 05/14/2020. The encounter with the patient was in whole, or in part for the following medical condition(s) which is the primary reason for home health care (List medical condition): CHF, debility   The encounter with the patient was in whole, or in part, for the following medical condition, which is the primary reason for home health care: CHF, debility   I certify that, based on my findings, the following services are medically necessary home health services: Physical therapy   Reason for Medically Necessary Home Health Services: Therapy- Personnel officer, Public librarian   My clinical findings support the need for the above services: Unable to leave home safely without assistance and/or assistive device   Further, I certify that my clinical findings support that this patient is homebound due to: Unable to leave home safely without assistance   Home Health   Complete by: As directed    To provide the following care/treatments: PT   Increase activity slowly   Complete by: As directed      Allergies as of 05/14/2020      Reactions   Aspirin Nausea Only      Medication List    STOP taking these medications   amoxicillin-clavulanate 875-125 MG  tablet Commonly known as: AUGMENTIN     TAKE these medications   Advair Diskus 100-50 MCG/DOSE Aepb Generic drug: Fluticasone-Salmeterol Inhale 1 puff into the lungs as needed.   albuterol 108 (90 Base) MCG/ACT inhaler Commonly known as: VENTOLIN HFA Inhale 1-2 puffs into the lungs every 6 (six) hours as needed for wheezing or shortness of  breath.   atorvastatin 10 MG tablet Commonly known as: LIPITOR Take 5 mg by mouth daily.   carvedilol 3.125 MG tablet Commonly known as: COREG Take 1 tablet (3.125 mg total) by mouth 2 (two) times daily with a meal.   colchicine 0.6 MG tablet Take 0.6 mg by mouth every other day.   ergocalciferol 1.25 MG (50000 UT) capsule Commonly known as: VITAMIN D2 Take 50,000 Units by mouth every Tuesday.   furosemide 40 MG tablet Commonly known as: LASIX Take 40 mg by mouth.   methocarbamol 500 MG tablet Commonly known as: ROBAXIN Take 500 mg by mouth as needed for muscle spasms.   pioglitazone 30 MG tablet Commonly known as: ACTOS Take 30 mg by mouth daily.   predniSONE 20 MG tablet Commonly known as: DELTASONE Take 2 tablets (40 mg total) by mouth daily with breakfast for 2 days.       Follow-up Information    [facility] Follow up on 05/21/2020.   Specialty: Cardiology Why: at 11:00am. Enter through the [facility] entrance Contact information: [place] [address] [phone]       Teodoro Spray, MD. Schedule an appointment as soon as possible for a visit on 2020-06-08.   Specialty: Cardiology Why: @ 10:30am Contact information: [place] [address] [phone]                Time coordinating discharge: 32 minutes  Signed:  Jennye Boroughs  [facility] 05/15/2020, 5:46 PM   Pager on [email or web address]. If 7PM-7AM, please contact night-coverage at [email or web address]

## 2020-05-15 DIAGNOSIS — J441 Chronic obstructive pulmonary disease with (acute) exacerbation: Secondary | ICD-10-CM | POA: Diagnosis present

## 2020-05-18 DIAGNOSIS — R404 Transient alteration of awareness: Secondary | ICD-10-CM | POA: Diagnosis not present

## 2020-05-18 DIAGNOSIS — R001 Bradycardia, unspecified: Secondary | ICD-10-CM | POA: Diagnosis not present

## 2020-05-18 DIAGNOSIS — I499 Cardiac arrhythmia, unspecified: Secondary | ICD-10-CM | POA: Diagnosis not present

## 2020-05-18 DIAGNOSIS — R4020 Unspecified coma: Secondary | ICD-10-CM | POA: Diagnosis not present

## 2020-05-20 NOTE — Progress Notes (Deleted)
   Patient ID: Islam Villescas, female    DOB: [birth year], 84 y.o.   MRN: [id number]  HPI  Ms Robotham is a 84 y/o female with a history of  Echo report from 05/11/20 reviewed and showed an EF of 60-65% along with mild LAE.   Admitted 05/10/20 due to COPD/HF exacerbation. Cardiology consult obtained. Initially given IV lasix with transition to oral medications. Given steroids, bronchodilators and empiric antibiotics. PT evaluation done. Discharged after 4 days. Was in the ED 05/09/20 due to acute rhinosinusitis where she was evaluated and released. Was in the ED 05/05/20 due to pericarditis where she was treated and released.   She presents today for her initial visit with a chief complaint of   Review of Systems    Physical Exam    Assessment & Plan:  1: Chronic heart failure with preserved ejection fraction with structural changes (LAE)- - NYHA class  2: HTN- - BP  3: CKD-  4: COPD-

## 2020-05-21 ENCOUNTER — Ambulatory Visit: Payer: Medicare Other | Admitting: Family

## 2020-06-17 DIAGNOSIS — 419620001 Death: Secondary | SNOMED CT | POA: Diagnosis not present

## 2020-06-17 DEATH — deceased

## 2021-04-29 IMAGING — US US RENAL
1 series · 14 of 25 positions shown · non-contrast
Comparison: None.

CLINICAL DATA: Acute kidney injury.

EXAM:
RENAL / URINARY TRACT ULTRASOUND COMPLETE

[Series 1: us renal · 14 of 37 slices shown]
[im 1/37]
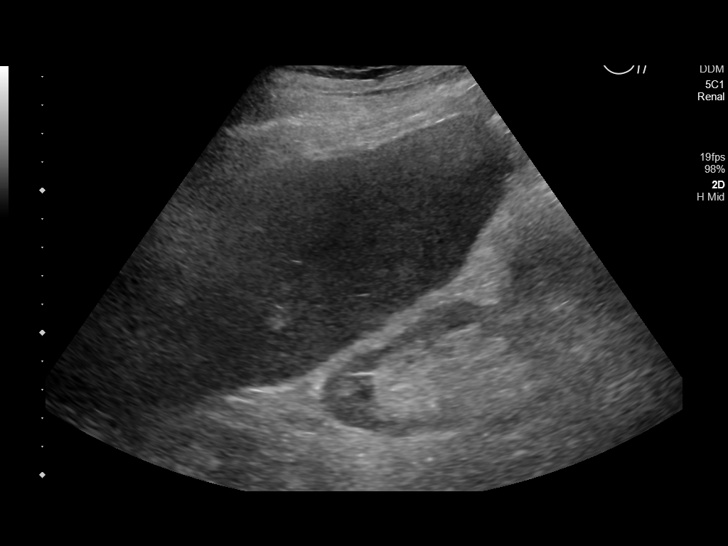
[im 4/37]
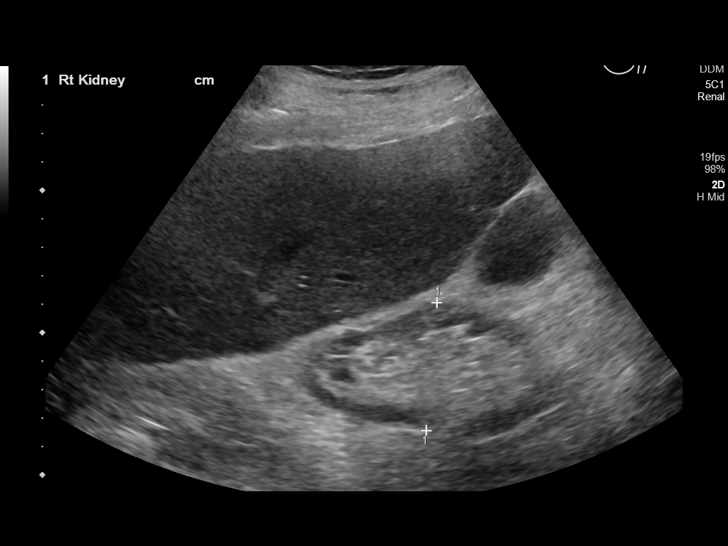
[im 7/37]
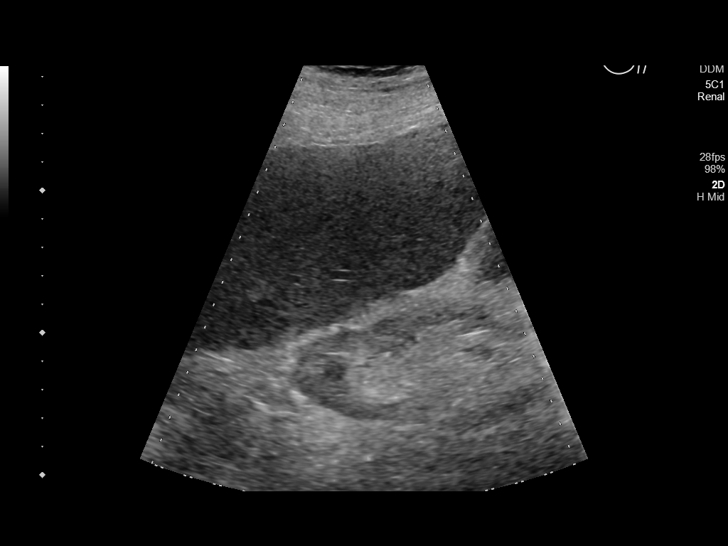
[im 10/37]
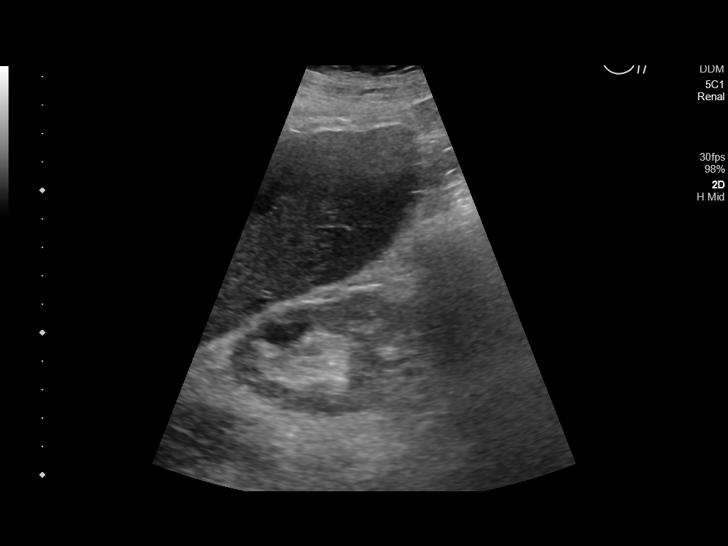
[im 13/37]
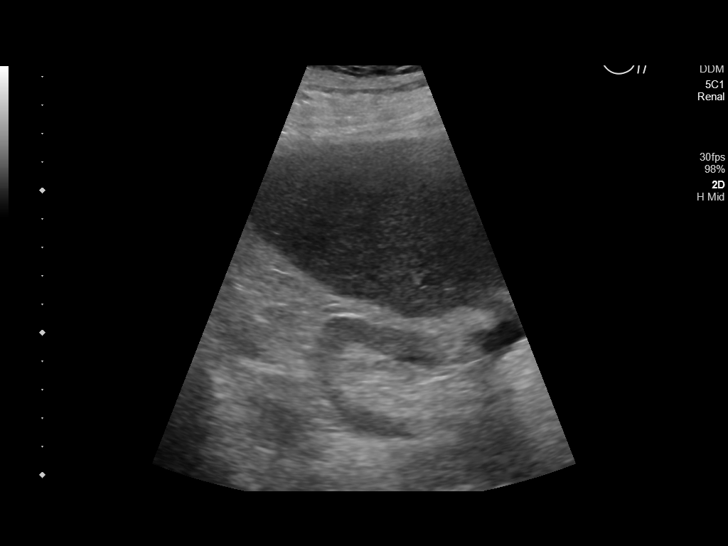
[im 14/37]
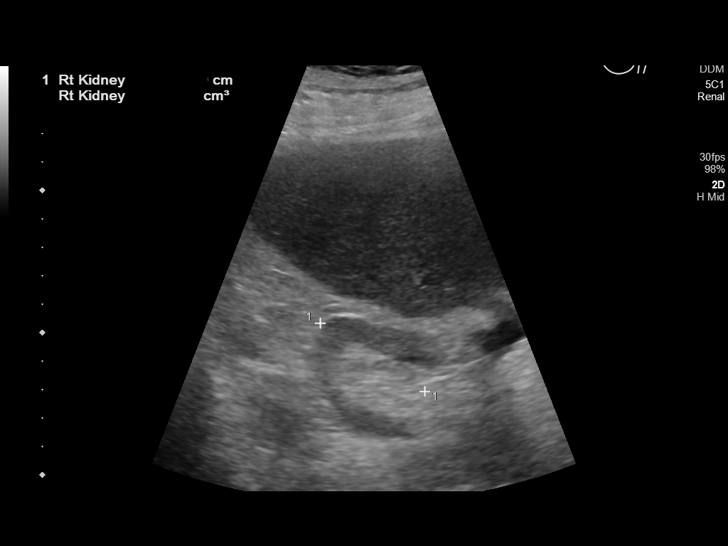
[im 17/37]
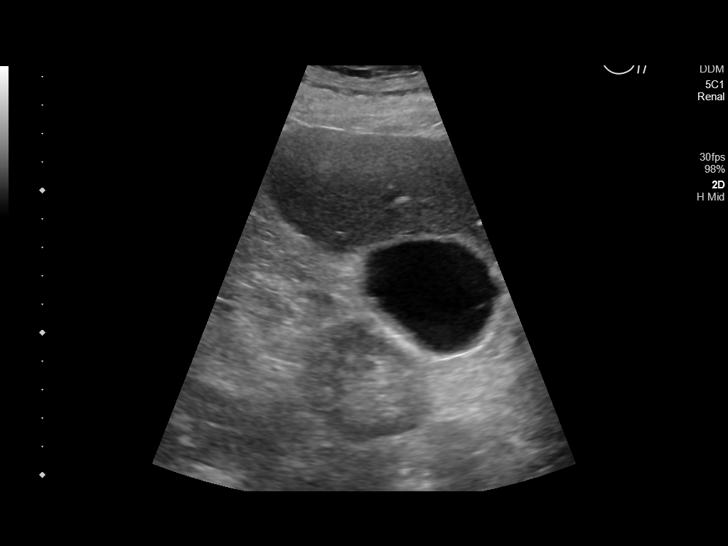
[im 20/37]
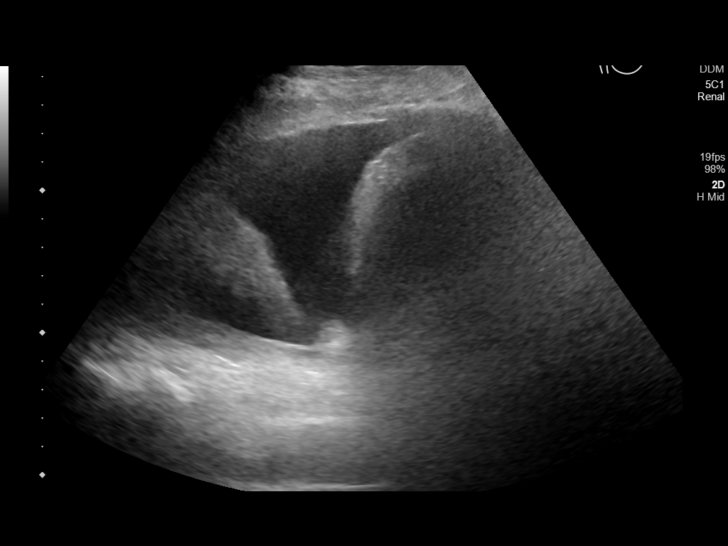
[im 23/37]
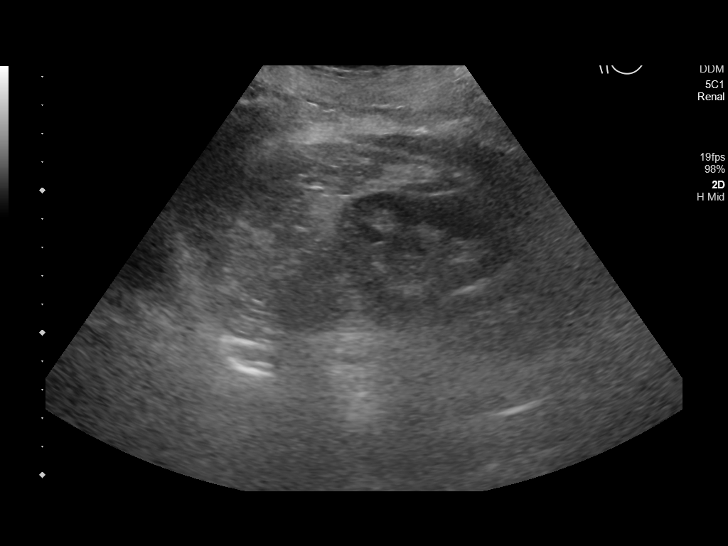
[im 25/37]
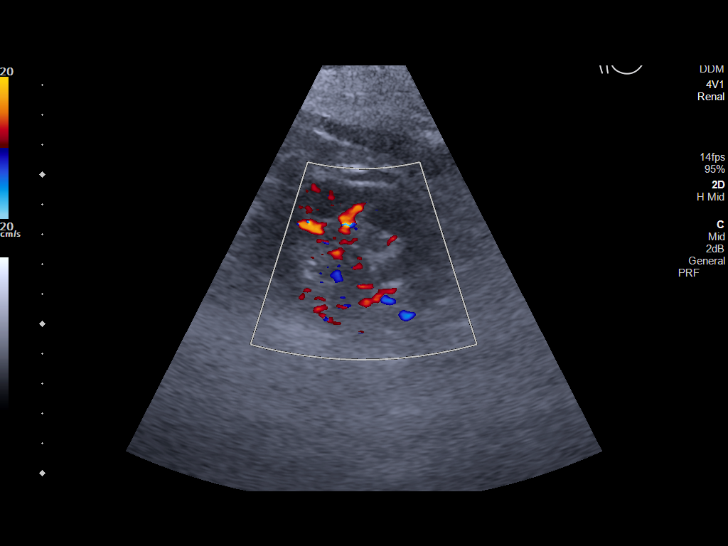
[im 28/37]
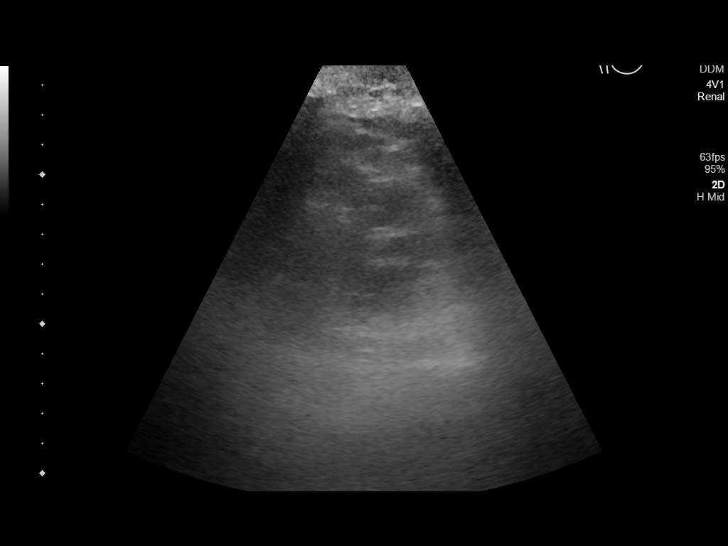
[im 31/37]
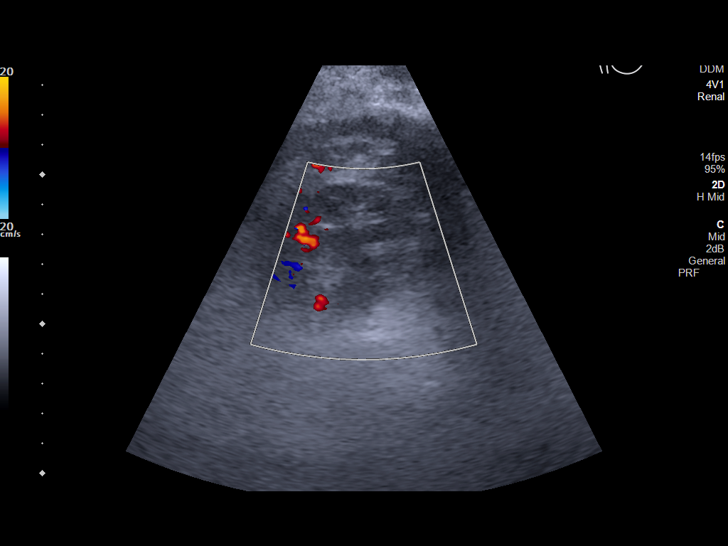
[im 34/37]
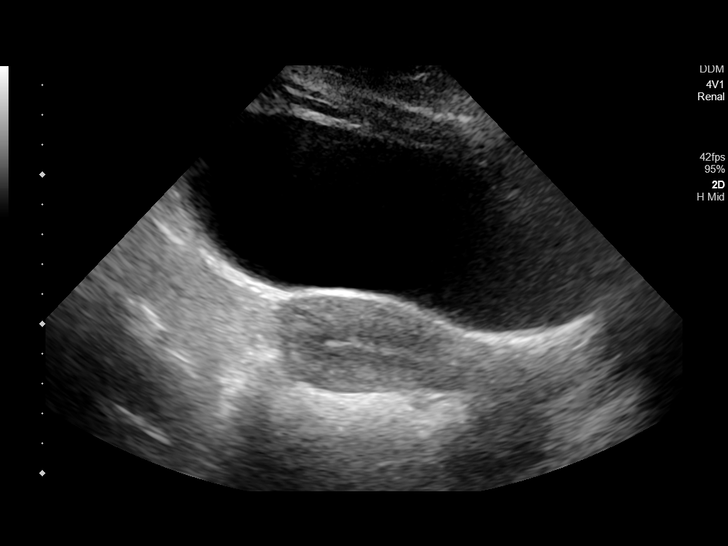
[im 37/37]
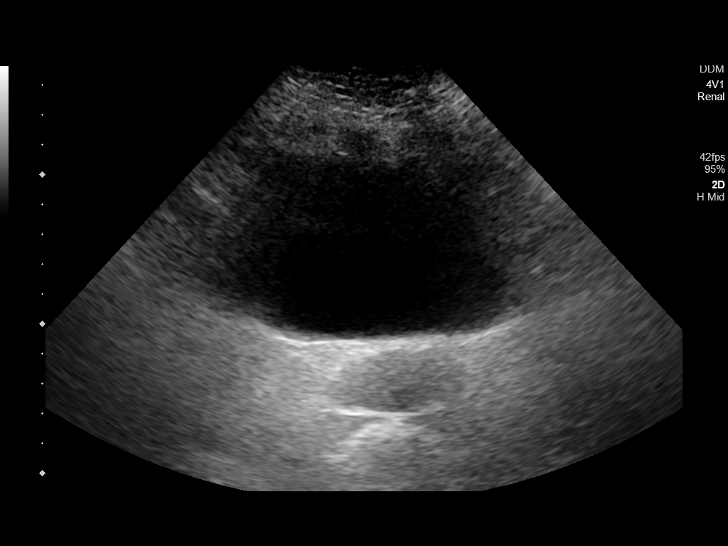

[14 of 25 positions shown; findings below may reference images not displayed]

FINDINGS: Right Kidney:

Renal measurements: 9.4 x 4.5 x 4.4 cm = volume: 97 mL. Increased
echogenicity of renal parenchyma is noted suggesting medical renal
disease. 1.7 cm simple cyst is noted in upper pole. No mass or
hydronephrosis visualized.

Left Kidney:

Renal measurements: 8.5 x 5.0 x 5.0 cm = volume: 112 mL. Increased
echogenicity of renal parenchyma is noted suggesting medical renal
disease. No mass or hydronephrosis visualized.

Bladder:

Appears normal for degree of bladder distention.

Other:

None.
IMPRESSION: Increased echogenicity of renal parenchyma is noted bilaterally
suggesting medical renal disease. Mild left renal atrophy is noted.
No hydronephrosis or renal obstruction is noted.

## 2021-05-01 IMAGING — CR DG CHEST 2V
2 series · 2 of 2 positions shown · non-contrast
Comparison: May 10, 2020.

CLINICAL DATA: Shortness of breath, chest pain.

EXAM:
CHEST - 2 VIEW

[chest lat]
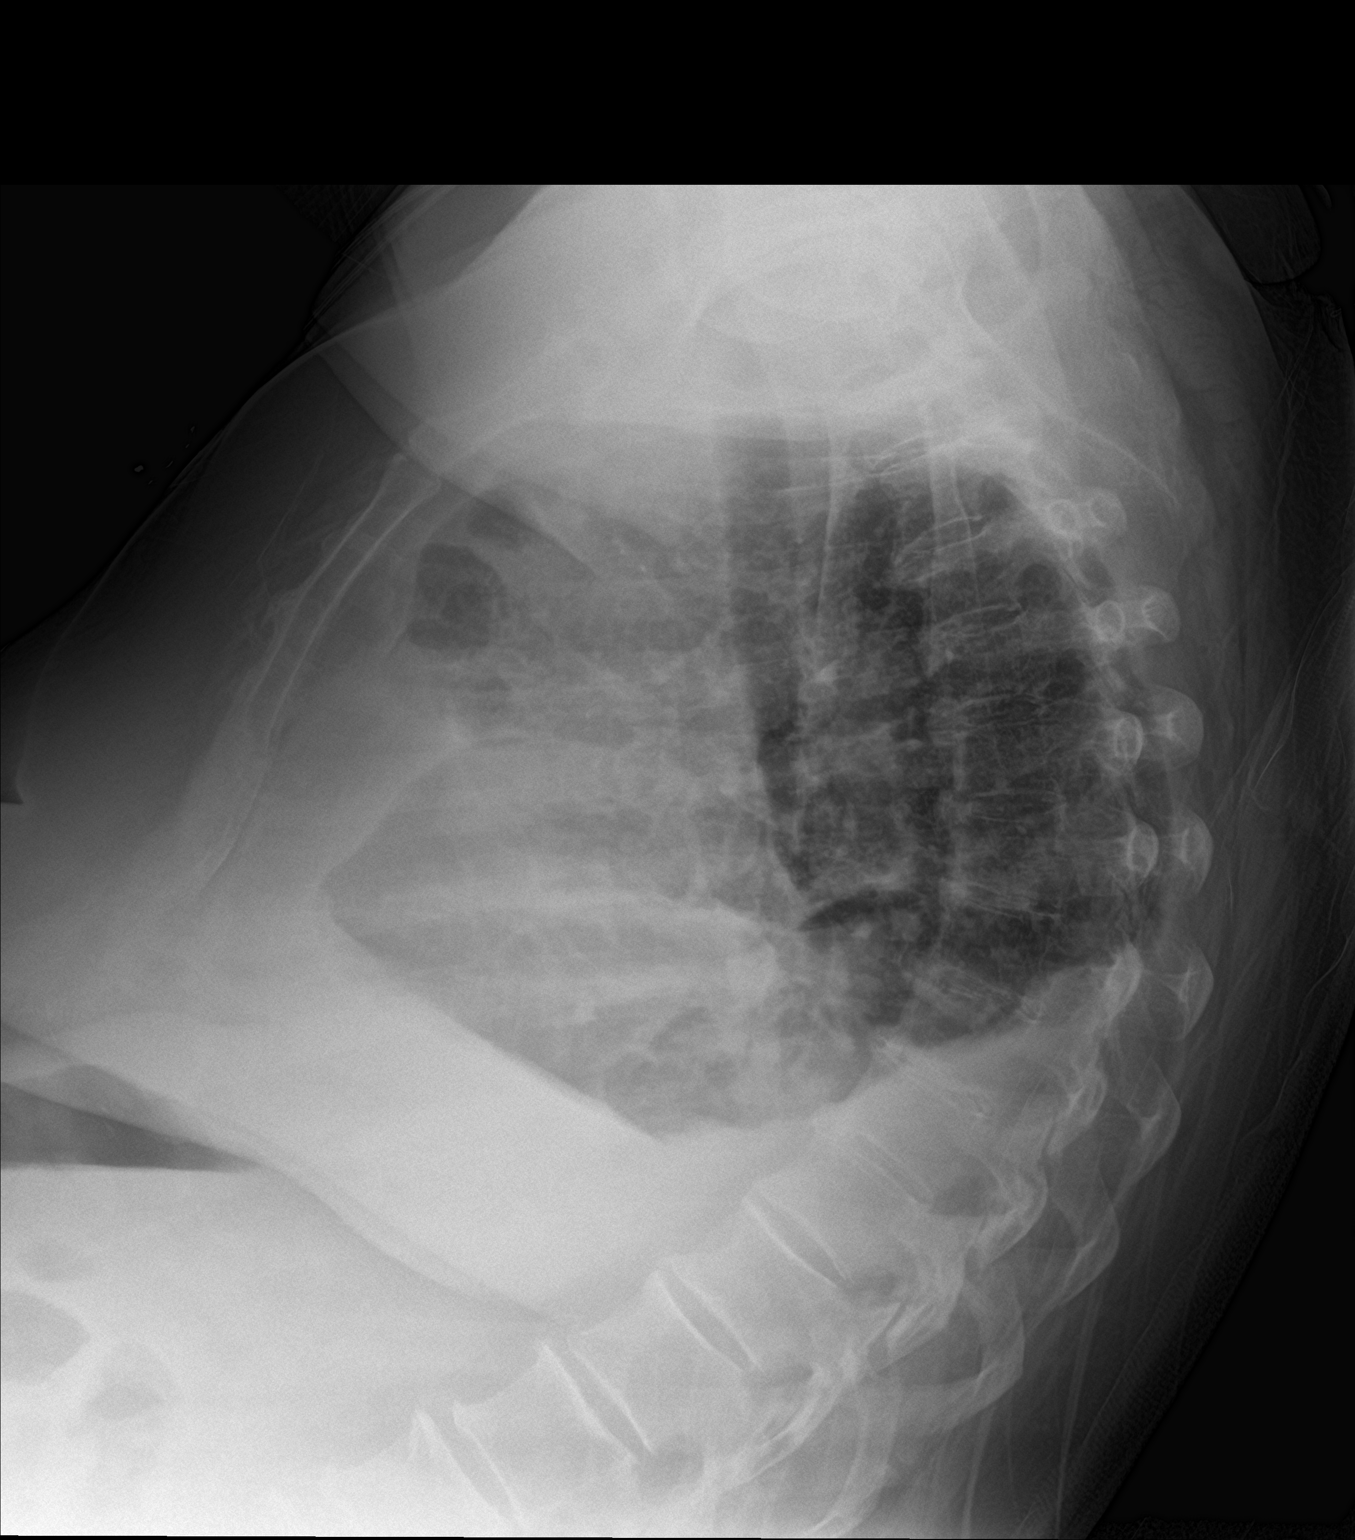

[chest ap]
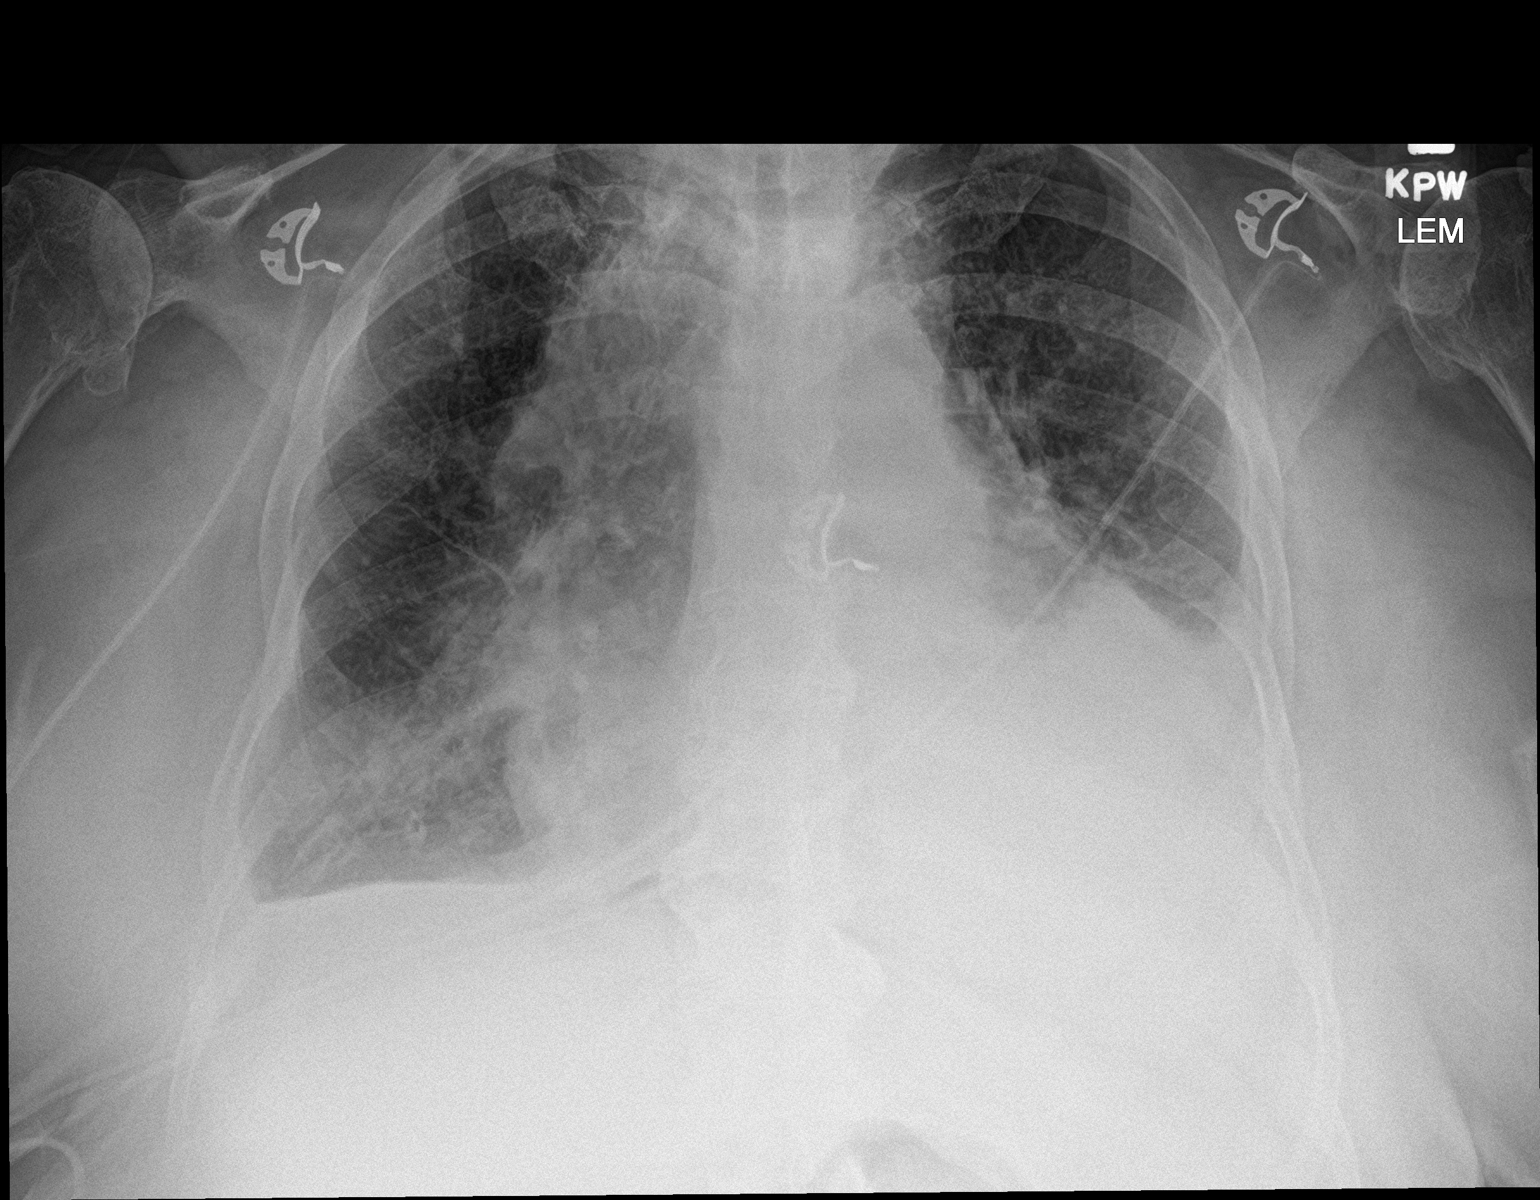

[2 of 2 positions shown; findings below may reference images not displayed]

FINDINGS: Stable cardiomegaly. No pneumothorax or pleural effusion is noted.
Bilateral pleural effusions are noted, left greater than right.
Bibasilar atelectasis or infiltrates are noted. Bony thorax is
unremarkable.
IMPRESSION: Bilateral pleural effusions, left greater than right. Bibasilar
atelectasis or infiltrates are noted.
